# Patient Record
Sex: Male | Born: 1965
Health system: Southern US, Community
[De-identification: ages and names within clinical notes are randomized; demographics above are authoritative.]

## PROBLEM LIST (undated history)

## (undated) DIAGNOSIS — L309 Dermatitis, unspecified: Secondary | ICD-10-CM

## (undated) DIAGNOSIS — M51369 Other intervertebral disc degeneration, lumbar region without mention of lumbar back pain or lower extremity pain: Secondary | ICD-10-CM

## (undated) DIAGNOSIS — M5417 Radiculopathy, lumbosacral region: Secondary | ICD-10-CM

## (undated) DIAGNOSIS — M5136 Other intervertebral disc degeneration, lumbar region: Secondary | ICD-10-CM

## (undated) DIAGNOSIS — E785 Hyperlipidemia, unspecified: Secondary | ICD-10-CM

## (undated) DIAGNOSIS — T7840XA Allergy, unspecified, initial encounter: Secondary | ICD-10-CM

## (undated) DIAGNOSIS — M62562 Muscle wasting and atrophy, not elsewhere classified, left lower leg: Secondary | ICD-10-CM

## (undated) DIAGNOSIS — J302 Other seasonal allergic rhinitis: Secondary | ICD-10-CM

## (undated) DIAGNOSIS — J45909 Unspecified asthma, uncomplicated: Secondary | ICD-10-CM

## (undated) HISTORY — PX: OTHER SURGICAL HISTORY: SHX169

## (undated) HISTORY — DX: Other intervertebral disc degeneration, lumbar region without mention of lumbar back pain or lower extremity pain: M51.369

## (undated) HISTORY — DX: Radiculopathy, lumbosacral region: M54.17

## (undated) HISTORY — DX: Allergy, unspecified, initial encounter: T78.40XA

## (undated) HISTORY — DX: Other seasonal allergic rhinitis: J30.2

## (undated) HISTORY — DX: Dermatitis, unspecified: L30.9

## (undated) HISTORY — DX: Unspecified asthma, uncomplicated: J45.909

## (undated) HISTORY — DX: Hyperlipidemia, unspecified: E78.5

## (undated) HISTORY — DX: Other intervertebral disc degeneration, lumbar region: M51.36

## (undated) HISTORY — DX: Muscle wasting and atrophy, not elsewhere classified, left lower leg: M62.562

---

## 1998-02-01 ENCOUNTER — Ambulatory Visit (HOSPITAL_COMMUNITY): Admission: RE | Admit: 1998-02-01 | Discharge: 1998-02-01 | Payer: Self-pay | Admitting: Family Medicine

## 1998-02-01 ENCOUNTER — Encounter: Payer: Self-pay | Admitting: Family Medicine

## 2015-03-23 HISTORY — PX: COLONOSCOPY: SHX174

## 2015-04-18 LAB — PSA: PSA: 1.03

## 2015-04-18 LAB — LIPID PANEL
CHOLESTEROL: 230 — AB (ref 0–200)
HDL: 61 (ref 35–70)
LDL CALC: 156
Triglycerides: 64 (ref 40–160)

## 2015-04-18 LAB — TSH: TSH: 2.18 (ref 0.41–5.90)

## 2015-05-19 ENCOUNTER — Other Ambulatory Visit: Payer: Self-pay | Admitting: Family Medicine

## 2015-05-19 ENCOUNTER — Ambulatory Visit
Admission: RE | Admit: 2015-05-19 | Discharge: 2015-05-19 | Disposition: A | Discharge status: Home / Self Care | Payer: 59 | Source: Ambulatory Visit | Attending: Family Medicine | Admitting: Family Medicine

## 2015-05-19 DIAGNOSIS — R059 Cough, unspecified: Secondary | ICD-10-CM

## 2015-05-19 DIAGNOSIS — R05 Cough: Secondary | ICD-10-CM

## 2015-05-20 HISTORY — PX: COLONOSCOPY: SHX174

## 2015-05-20 LAB — HM COLONOSCOPY

## 2016-09-02 ENCOUNTER — Encounter: Payer: Self-pay | Admitting: Genetics

## 2016-09-02 ENCOUNTER — Ambulatory Visit (HOSPITAL_BASED_OUTPATIENT_CLINIC_OR_DEPARTMENT_OTHER): Payer: 59 | Admitting: Genetics

## 2016-09-02 ENCOUNTER — Other Ambulatory Visit: Payer: 59

## 2016-09-02 DIAGNOSIS — Z7183 Encounter for nonprocreative genetic counseling: Secondary | ICD-10-CM | POA: Diagnosis not present

## 2016-09-02 DIAGNOSIS — Z8481 Family history of carrier of genetic disease: Secondary | ICD-10-CM | POA: Diagnosis not present

## 2016-09-02 NOTE — Progress Notes (Signed)
REFERRING PROVIDER: No referring provider defined for this encounter.  PRIMARY PROVIDER:  Shirline Frees, MD  PRIMARY REASON FOR VISIT:  1. Family history of BRCA2 gene positive   2. Family history of gene mutation      HISTORY OF PRESENT ILLNESS:   Troy Taylor, a 51 y.o. male, was seen for a [place] cancer genetics consultation at the request of himself/his parents due to known familial mutations in [place] and CHEK2. Troy Taylor father underwent genetic testing for hereditary cancer syndromes in March 2018 and was identified to have heritable mutations in BRCA2 and CHEK2.  Troy Taylor presents to clinic today to discuss the possibility of a hereditary predisposition to cancer, genetic testing, and to further clarify his future cancer risks, as well as potential cancer risks for family members. Troy Taylor paternal half-brother, Troy Taylor, was also present for the genetic counseling session.  Troy Taylor is a 51 y.o. male with no personal history of cancer. He reports that he had a colonoscopy earlier this year that was normal. He also reports that he had prostate cancer screening within the past two years which was normal.  CANCER HISTORY:   No history exists.    No past medical history on file.  No past surgical history on file.  Social History   Social History  . Marital status: Married    Spouse name: N/A  . Number of children: N/A  . Years of education: N/A   Social History Main Topics  . Smoking status: Not on file  . Smokeless tobacco: Not on file  . Alcohol use Not on file  . Drug use: Unknown  . Sexual activity: Not on file   Other Topics Concern  . Not on file   Social History Narrative  . No narrative on file     FAMILY HISTORY:  We obtained a detailed, 4-generation family history.  Significant diagnoses are listed below: Family History  Problem Relation Age of Onset  . Renal cancer Father 44  . Lymphoma Father   . BRCA 1/2 Father        BRCA2 mutation  positive  . Other Father        CHEK2 mutation positive  . Breast cancer Paternal Grandmother 41       d.60  . Uterine cancer Paternal Grandmother 5   Troy Taylor has two sons, ages 25 and 13. He also has a paternal half-brother, Troy Taylor, who is 50. Mr. Forse sons and brother are without cancers.  Mr. Heo father, Troy Taylor DOB 08/28/1936, has a history of renal cancer in his 86s and lymphoma in his 41s. Mr. Lightcap father underwent genetic testing in March 2018 through [facility]'s Multi-Cancers Panel which included analysis of 80 genes linked to hereditary cancer risk. Testing revealed a mutation in the BRCA2gene called [id number] ([id number]*). 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 Of note, the laboratory shared that they can not rule out the possibility that his BRCA2 mutation is also mosaic and thus not reflective of what is present in his germline (constitutional) DNA. Testing of a different cell line (such as a skin biopsy) or finding the mutation in his children would help determine whether either the BRCA2 pathogenic variant and/or the CHEK2 likely pathogenic variant are present in his germline DNA. A copy of Mr. Niess father's the genetic test report is included at the end of this note for reference. Mr. Prom  father is an only child. Mr. Goeken paternal grandmother was diagnosed with breast and uterine cancer at 33 and died at 46. Mr. Smiles paternal grandfather died at 22 from renal failure.  Mr. Whistler mother is 79 without cancers. Mr. Gertsch has two maternal uncles and one maternal aunt who are all in their 5s without cancers. Mr. Raulston maternal grandparents died in their 10s and 66s without cancers.  Patient's maternal ancestors are of [place] descent, and paternal ancestors are of Polish/Ashkenazi Jewish descent. There is no known consanguinity.  GENETIC COUNSELING  ASSESSMENT: Troy Taylor is a 51 y.o. male with a father who carries mutations in BRCA2 and CHEK2. Though it is currently uncertain whether these mutations are germline or somatic, Troy Taylor has as much as a 50% chance to also carry each of these mutations. We, therefore, discussed and recommended the following at today's visit.   DISCUSSION: We reviewed the characteristics, features and inheritance patterns of hereditary cancer syndromes with a specific focus on BRCA2 and CHEK2-related risks. We also discussed genetic testing, including the appropriate family members to test, the process of testing, insurance coverage and turn-around-time for results. We discussed the implications of a negative, positive and/or variant of uncertain significant result. Troy Taylor was given multiple options for the extent of genetic testing he would like to pursue. We specifically reviewed the following three options: 1) Test only the BRCA2 and CHEK2 genes to determine whether he carries the two mutations identified by his father's testing. This testing is free under [facility]'s Family Variant Testing program. 2) Test through the 46-gene Common Hereditary Cancers Panel offered by [facility] 3) Test through the 83-gene Multi-Cancers Panel offered by [facility]. The benefits and limitations of each option were discussed. Troy Taylor elected to pursue genetic testing for the 46-gene Common Hereditary Cancers Panel. [facility]'s Common Hereditary Cancers Panel includes analysis of the following 46 genes: APC, ATM, AXIN2, BARD1, BMPR1A, BRCA1, BRCA2, BRIP1, CDH1, CDKN2A, CHEK2, CTNNA1, DICER1, EPCAM, GREM1, HOXB13, KIT, MEN1, MLH1, MSH2, MSH3, MSH6, MUTYH, NBN, NF1, NTHL1, PALB2, PDGFRA, PMS2, POLD1, POLE, PTEN, RAD50, RAD51C, RAD51D, SDHA, SDHB, SDHC, SDHD, SMAD4, SMARCA4, STK11, TP53, TSC1, TSC2, and VHL.   Based on Troy Taylor family history of cancer, he meets medical criteria for genetic testing. Despite that he meets criteria, he may  still have an out of pocket cost. We discussed that if his out of pocket cost for testing is over $100, the laboratory will call and confirm whether he wants to proceed with testing.  If the out of pocket cost of testing is less than $100 he will be billed by the genetic testing laboratory.   PLAN: After considering the risks, benefits, and limitations, Troy Taylor  provided informed consent to pursue genetic testing and the blood sample was sent to [facility] for analysis of the 46-gene Common Hereditary Cancers Panel. Results should be available within approximately 3 weeks' time, at which point they will be disclosed by telephone to Troy Taylor, as will any additional recommendations warranted by these results. This information will also be available in Epic. We encouraged Troy Taylor to remain in contact with cancer genetics annually so that we can continuously update the family history and inform him of any changes in cancer genetics and testing that may be of benefit for his family. Troy Taylor questions were answered to his satisfaction today. Our contact information was provided should additional questions or concerns arise.  Lastly, we encouraged Troy Taylor to remain in contact with cancer genetics  annually so that we can continuously update the family history and inform him of any changes in cancer genetics and testing that may be of benefit for this family.   Mr.  Taranto questions were answered to his satisfaction today. Our contact information was provided should additional questions or concerns arise. Thank you for the referral and allowing [name] to share in the care of your patient.   Mal Misty, MS, [facility] Certified Naval [email or web address] phone: [phone]  The patient was seen for a total of 30 minutes in face-to-face genetic counseling.  This patient was discussed with Drs. Magrinat, Lindi Adie and/or Burr Medico who agrees with the above.     _______________________________________________________________________ For Office Staff:  Number of people involved in session: 2 Was an Intern/ student involved with case: no

## 2016-09-24 ENCOUNTER — Telehealth: Payer: Self-pay | Admitting: Genetics

## 2016-09-24 NOTE — Telephone Encounter (Signed)
Mr. Troy Taylor called to ask about a bill he received from [facility]. He stated that [facility] and he was under the impression that his genetic testing was free so he was calling with questions. I discussed the following with Mr. Troy Taylor:  1) His genetic counseling appointment was billed, though I have no further information about what was or was not covered by his insurance for the appointment. I offered to put him in touch with [facility] billing department to discuss the bill and billing process further. He declines at this time, stating that he will contact [facility] instead.  2) Genetic testing is billed separately from his genetic counseling appointment. The billing for genetic testing is performed by the genetic testing laboratory, [facility]. [facility]'s policy is to call Mr. Troy Taylor if his out-of-pocket (OOP) expense for the testing is expected to exceed $100. I spoke with the laboratory this morning and they informed me that they have an authorization on file from [employer] [facility]. However, this does not mean that Mr. Troy Taylor will not have OOP expense. The lab will contact Mr. Troy Taylor if his OOP is greater than $100 and at that time, he would have the chance to cancel.  Mr. Troy Taylor stated that he will call me back if he has further questions.

## 2016-09-27 ENCOUNTER — Telehealth: Payer: Self-pay | Admitting: Genetics

## 2016-09-27 NOTE — Telephone Encounter (Signed)
Troy Taylor left me a message stating that he received email communication from [facility] that his out-of-pocket (OOP) responsibility for his genetic testing ordered on 09/02/2016 would be approximately $800. He stated that the email he received also reviewed various payment options. Troy Taylor voicemessage stated that he does not want to pay $800 for a test that he thought was going to be free. I called Troy Taylor back to review the following:  1) The testing that would have been free would have been performed through [facility]'s Family Variant Testing program which tests only the genes found to have mutations in his father (BRCA2 and CHEK2). That option was discussed at the time of his initial genetic counseling appointment. At the time of our meeting, Troy Taylor elected to test through a larger panel of 46-genes. Therefore, testing was billed to Troy Taylor insurance, thus the $800 OOP. We discussed that this larger panel would get billed to his insurance at the time of our initial meeting.  2) I encouraged Troy Taylor to contact [facility] directly regarding his cost/payment options from here.  3) Troy Taylor wanted to know if he still had the option to only test BRCA2 and CHEK2 through [facility]'s Family Variant Testing program at no cost. I called [facility] to inquire about this and was informed that this should be an option, but they would need me to provide a written request for this change in testing via [facility]'s portal. I called Troy Taylor to inform him of this option. He stated that he would like to read the email from [facility] more thoroughly before deciding. He stated that if he can pay $250 for the larger panel, he may still elect to do that. I clarified with Troy Taylor that I will not take any further action to change his testing until I hear back from him regarding his decision. He agreed to this plan. I will await his call.

## 2016-10-19 NOTE — Telephone Encounter (Signed)
Troy Taylor informed me that he would like to proceed with genetic testing through [facility]'s patient-pay price of $250 for testing of 46-gene through the Common Hereditary Cancers Panel. I agreed with Troy Taylor that I would inform [facility] of his decision and [facility] will contact him regarding payment. I will call Troy Taylor when results are available.

## 2016-11-04 ENCOUNTER — Telehealth: Payer: Self-pay | Admitting: Genetics

## 2016-11-11 ENCOUNTER — Emergency Department (HOSPITAL_COMMUNITY)
Admission: EM | Admit: 2016-11-11 | Discharge: 2016-11-11 | Disposition: A | Discharge status: Home / Self Care | Payer: 59 | Attending: Emergency Medicine | Admitting: Emergency Medicine

## 2016-11-11 ENCOUNTER — Encounter (HOSPITAL_COMMUNITY): Payer: Self-pay

## 2016-11-11 ENCOUNTER — Emergency Department (HOSPITAL_COMMUNITY): Payer: 59

## 2016-11-11 DIAGNOSIS — R0602 Shortness of breath: Secondary | ICD-10-CM | POA: Insufficient documentation

## 2016-11-11 DIAGNOSIS — J45909 Unspecified asthma, uncomplicated: Secondary | ICD-10-CM | POA: Diagnosis not present

## 2016-11-11 DIAGNOSIS — R42 Dizziness and giddiness: Secondary | ICD-10-CM

## 2016-11-11 DIAGNOSIS — R61 Generalized hyperhidrosis: Secondary | ICD-10-CM | POA: Diagnosis not present

## 2016-11-11 DIAGNOSIS — R110 Nausea: Secondary | ICD-10-CM | POA: Insufficient documentation

## 2016-11-11 DIAGNOSIS — R079 Chest pain, unspecified: Secondary | ICD-10-CM | POA: Diagnosis not present

## 2016-11-11 DIAGNOSIS — R000 Tachycardia, unspecified: Secondary | ICD-10-CM | POA: Diagnosis not present

## 2016-11-11 DIAGNOSIS — R9431 Abnormal electrocardiogram [ECG] [EKG]: Secondary | ICD-10-CM | POA: Diagnosis not present

## 2016-11-11 LAB — CBC
HCT: 44 % (ref 39.0–52.0)
HEMOGLOBIN: 14.6 g/dL (ref 13.0–17.0)
MCH: 28 pg (ref 26.0–34.0)
MCHC: 33.2 g/dL (ref 30.0–36.0)
MCV: 84.5 fL (ref 78.0–100.0)
Platelets: 191 10*3/uL (ref 150–400)
RBC: 5.21 MIL/uL (ref 4.22–5.81)
RDW: 13.7 % (ref 11.5–15.5)
WBC: 6 10*3/uL (ref 4.0–10.5)

## 2016-11-11 LAB — BASIC METABOLIC PANEL
ANION GAP: 7 (ref 5–15)
BUN: 20 mg/dL (ref 6–20)
CHLORIDE: 109 mmol/L (ref 101–111)
CO2: 27 mmol/L (ref 22–32)
Calcium: 9.6 mg/dL (ref 8.9–10.3)
Creatinine, Ser: 1.16 mg/dL (ref 0.61–1.24)
GFR calc non Af Amer: >60 mL/min (ref >60)
Glucose, Bld: 88 mg/dL (ref 65–99)
POTASSIUM: 4.4 mmol/L (ref 3.5–5.1)
Sodium: 143 mmol/L (ref 135–145)

## 2016-11-11 LAB — I-STAT TROPONIN, ED
TROPONIN I, POC: 0 ng/mL (ref 0.00–0.08)
Troponin i, poc: 0 ng/mL (ref 0.00–0.08)

## 2016-11-11 NOTE — Discharge Instructions (Signed)
Try Zyrtec for allergies Drink plenty of fluids Please follow up with your doctor

## 2016-11-11 NOTE — ED Triage Notes (Signed)
Pt presents from PCP office for abnormal EKG. Pt reports woke up this AM and was dizziness and weak. No focal neuro symptoms. LKW last night. Pt reports some intermittent chest tightness/SOB x 1 week.

## 2016-11-11 NOTE — ED Provider Notes (Signed)
[facility] DEPT Provider Note   CSN: [id number] Arrival date & time: 11/11/16  1021     History   Chief Complaint Chief Complaint  Patient presents with  . Abnormal ECG    HPI Troy Taylor is a 51 y.o. male who presents with dizziness. PMH of allergies and asthma. He states that he woke up this morning acutely dizzy. He had associated chills, diaphoresis, and weakness. He sat up and the dizziness got better. It did not feel like he was going to pass out. He states it feels like the "worst hangover". He went to his doctor's office who performed an EKG which appeared changed compared to prior so they sent him to the ED for cardiac eval. He states he has had intermittent chest tightness and SOB over the past couple weeks. He reports insomnia as well - sleeping about 3-4 hours a night. He has a dull headache, nausea, and bilateral leg cramps/aches. He has been working outside a lot recently moving mulch. He cannot recall pulling ticks off of him or a rash. He has never smoked. No family hx of cardiac disease. He is not on any medicines. No recent surgeries/trauma, travel, hormone use, hx of DVT/PE, or hemoptysis.  HPI  History reviewed. No pertinent past medical history.  There are no active problems to display for this patient.   History reviewed. No pertinent surgical history.     Home Medications    Prior to Admission medications   Not on File    Family History Family History  Problem Relation Age of Onset  . Renal cancer Father 64  . Lymphoma Father   . BRCA 1/2 Father        BRCA2 mutation positive  . Other Father        CHEK2 mutation positive  . Breast cancer Paternal Grandmother 18       d.60  . Uterine cancer Paternal Grandmother 68    Social History Social History  Substance Use Topics  . Smoking status: Never Smoker  . Smokeless tobacco: Never Used  . Alcohol use No     Allergies   Penicillins   Review of Systems Review of Systems    Constitutional: Positive for chills, diaphoresis and fatigue. Negative for fever.  Respiratory: Positive for chest tightness and shortness of breath. Negative for cough and wheezing.   Cardiovascular: Negative for chest pain, palpitations and leg swelling.  Gastrointestinal: Positive for nausea. Negative for abdominal pain, diarrhea and vomiting.  Genitourinary: Negative for difficulty urinating.       -dark urine  Musculoskeletal: Positive for myalgias.  Allergic/Immunologic: Positive for environmental allergies.  Neurological: Positive for dizziness, weakness (generalized) and headaches. Negative for syncope.     Physical Exam Updated Vital Signs BP 125/82 (BP Location: Right Arm)   Pulse (!) 57   Temp 97.6 F (36.4 C) (Oral)   Resp 18   SpO2 100%   Physical Exam  Constitutional: He is oriented to person, place, and time. He appears well-developed and well-nourished. No distress.  HENT:  Head: Normocephalic and atraumatic.  Eyes: Pupils are equal, round, and reactive to light. Conjunctivae are normal. Right eye exhibits no discharge. Left eye exhibits no discharge. No scleral icterus.  Neck: Normal range of motion.  Cardiovascular: Normal rate and regular rhythm.   Pulmonary/Chest: Effort normal. No respiratory distress.  Abdominal: He exhibits no distension.  Neurological: He is alert and oriented to person, place, and time.  Lying on stretcher in NAD. GCS 15. Speaks  in a clear voice. Cranial nerves II through XII grossly intact. 5/5 strength in all extremities. Sensation fully intact.  Bilateral finger-to-nose intact. Ambulatory    Skin: Skin is warm and dry.  Psychiatric: He has a normal mood and affect. His behavior is normal.  Nursing note and vitals reviewed.    ED Treatments / Results  Labs (all labs ordered are listed, but only abnormal results are displayed) Labs Reviewed  BASIC METABOLIC PANEL  CBC  I-STAT TROPONIN, ED  I-STAT TROPONIN, ED    EKG  EKG  Interpretation  Date/Time:  Thursday November 11 2016 10:23:48 EDT Ventricular Rate:  56 PR Interval:  162 QRS Duration: 78 QT Interval:  408 QTC Calculation: 393 R Axis:   84 Text Interpretation:  Sinus bradycardia with sinus arrhythmia Otherwise normal ECG No previous ECGs available Confirmed by Theotis Burrow [phone]) on 11/11/2016 11:51:06 AM       Radiology Dg Chest 2 View  Result Date: 11/11/2016 CLINICAL DATA:  Chest pressure sensation associated with dizziness and weakness. Nonsmoker. EXAM: CHEST  2 VIEW COMPARISON:  Chest x-ray of May 19, 2015 FINDINGS: The lungs are well-expanded and clear. The heart and pulmonary vascularity are normal. The mediastinum is normal in width. There is no pleural effusion. The bony thorax exhibits no acute abnormality. IMPRESSION: Mild hyperinflation may be voluntary or may reflect underlying reactive airway disease or chronic bronchitis. No pneumonia, CHF, nor other acute cardiopulmonary abnormality. Electronically Signed   By: David  Martinique M.D.   On: 11/11/2016 10:49    Procedures Procedures (including critical care time)  Medications Ordered in ED Medications - No data to display   Initial Impression / Assessment and Plan / ED Course  I have reviewed the triage vital signs and the nursing notes.  Pertinent labs & imaging results that were available during my care of the patient were reviewed by me and considered in my medical decision making (see chart for details).  51 year old male presents with a constellation of symptoms. Unclear etiology - possibly a viral infection since he describes flu like symptoms. He is bradycardic which is likely physiologic for him in an overall healthy middle aged male. All other vitals are normal. EKG shows sinus bradycardia. CXR is negative. Labs are normal. 1st and 2nd troponin are normal.   Advised increase fluids and take Zyrtec for allergy symptoms. Discussed with patient that if he is very dizzy we  can administer fluids. He declined. Will d/c with close f/u with PCP.  Final Clinical Impressions(s) / ED Diagnoses   Final diagnoses:  Dizziness  SOB (shortness of breath)    New Prescriptions New Prescriptions   No medications on file     Iris Pert 11/12/16 [id number]    Little, Wenda Overland, MD 11/13/16 [phone]

## 2016-11-16 ENCOUNTER — Ambulatory Visit: Payer: Self-pay | Admitting: Genetics

## 2016-11-16 DIAGNOSIS — Z1379 Encounter for other screening for genetic and chromosomal anomalies: Secondary | ICD-10-CM | POA: Insufficient documentation

## 2016-11-16 NOTE — Progress Notes (Signed)
Results and genetic counseling documentation mailed to Troy Taylor address on file 11/16/2016.

## 2016-11-16 NOTE — Progress Notes (Signed)
HPI:Mr. Spagnolo was previously seen in the [place] clinic on 09/02/2016 due to his father's positive genetic testing results and a family history of cancer. Mr. Corales father underwent genetic testing for hereditary cancer syndromes in March 2018 and was identified to have heritable mutations in BRCA2 and CHEK2. Please refer to our prior cancer genetics clinic note for more information regarding Mr. Endsley medical, social and family histories, and our assessment and recommendations, at the time. Mr. Truex recent genetic test results were disclosed to him, as were recommendations warranted by these results. These results and recommendations are discussed in more detail below.  CANCER HISTORY: Mr. Wahlen is a 51 y.o. male with no personal history of cancer. He reports that he had a colonoscopy earlier this year that was normal. He also reports that he had prostate cancer screening within the past two years which was normal.  FAMILY HISTORY:  We obtained a detailed, 4-generation family history.  Significant diagnoses are listed below: Family History  Problem Relation Age of Onset  . Renal cancer Father 58  . Lymphoma Father   . BRCA 1/2 Father        BRCA2 mutation positive  . Other Father        CHEK2 mutation positive  . Breast cancer Paternal Grandmother 78       d.60  . Uterine cancer Paternal Grandmother 84   Mr. Vanaman has two sons, ages 89 and 1. He also has a paternal half-brother, [name], who is 76. Mr. Buchta sons and brother are without cancers.  Mr. Otten father, Lestat Golob DOB 08/28/1934, has a history of renal cancer in his 53s and lymphoma in his 66s. Mr. Moya father underwent genetic testing in March 2018 through [facility] Multi-Cancers Panel which included analysis of 80 genes linked to hereditary cancer risk. Testing revealed a mutation in the BRCA2gene called c.8210T>A ([id number]*). 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 Of note, the laboratory shared that they can not rule out the possibility that his BRCA2 mutation is also mosaic and thus not reflective of what is present in his germline (constitutional) DNA. Testing of a different cell line (such as a skin biopsy) or finding the mutation in his children would help determine whether either the BRCA2 pathogenic variant and/or the CHEK2 likely pathogenic variant are present in his germline DNA. A copy of Mr. Salguero father'sthe genetic test report is included at the end of this note for reference. Mr. Lefevers father is an only child. Mr. Rawles paternal grandmother was diagnosed with breast and uterine cancer at 67 and died at 81. Mr. Keehan paternal grandfather died at 32 from renal failure.  Mr. Cantera mother is 52 without cancers. Mr. Heppler has two maternal uncles and one maternal aunt who are all in their 72s without cancers. Mr. Doolittle maternal grandparents died in their 46s and 27s without cancers.  Patient's maternal ancestors are of [place] descent, and paternal ancestors are of Polish/Ashkenazi Jewish descent. There is no known consanguinity.  GENETIC TEST RESULTS: Genetic testing performed through [facility] Common Hereditary Cancer Panel reported out on 10/25/2016 showed no pathogenic mutations. [facility] Common Hereditary Cancers Panel includes analysis of the following 46 genes: APC, ATM, AXIN2, BARD1, BMPR1A, BRCA1, BRCA2, BRIP1, CDH1, CDKN2A, CHEK2, CTNNA1, DICER1, EPCAM, GREM1, HOXB13, KIT, MEN1, MLH1, MSH2, MSH3, MSH6, MUTYH, NBN, NF1, NTHL1, PALB2, PDGFRA, PMS2, POLD1, POLE, PTEN, RAD50, RAD51C, RAD51D, SDHA,  SDHB, SDHC, SDHD, SMAD4, SMARCA4, STK11, TP53, TSC1, TSC2, and VHL.  Specifically, Mr. Batty tested negative for the BRCA2, c.8210T>A ([id number]*) and CHEK2, H.7414+2L>T (Splice donor) mutations that were previously identified in his father.  The test  report will be scanned into EPIC and will be located under the Molecular Pathology section of the Results Review tab.A portion of the result report is included below for reference.    CANCER SCREENING RECOMMENDATIONS: Given this negative result, it appears that Mr. Chavarin chances of developing BRCA2 and CHEK2-related cancers (male breast, prostate, pancreatic, melanoma, colon cancer) are the same as they are in the general population.   Because Mr. Yutzy father's cancer diagnoses remain unexplained (lymphoma and renal cancers are not known to be associated with BRCA2 and CHEK2 mutations) Mr. Comer risk for lymphoma and renal cancer may be above-average based on family history alone. However, currently, there are no established screening guidelines for individuals with a family history of lymphoma and renal cancer. If Mr. Weissberg would like to consider screening for these cancers, he should consult his primary care physician regarding options. Otherwise, Mr. Baldree is advised to follow age-based, general population cancer screening recommendations unless otherwise advised by his physicians.   Because Mr. Fludd genetic testing was negative for mutations in all 46 genes analyzed, there is no genetic testing recommended for his children due to his family history of cancer.   FOLLOW-UP: Lastly, cancer genetics is a rapidly advancing field and it is possible that new genetic tests will be appropriate for him and/or his family members in the future. We encourage him to remain in contact with cancer genetics on an annual basis so we can update his personal and family histories and let him know of advances in cancer genetics that may benefit this family.   Our contact number was provided. Mr. Pingree questions were answered to his satisfaction, and he knows he is welcome to call us at anytime with additional questions or concerns.   Mal Misty, MS, [facility] Certified Scientist, [email or web address]

## 2016-11-16 NOTE — Telephone Encounter (Signed)
Reviewed that germline genetic testing revealed no pathogenic mutations. This is considered to be a negative result. Testing was performed through [facility]'s 46-gene Common Hereditary Cancers Panel. [facility] Common Hereditary Cancers Panel includes analysis of the following 46 genes: APC, ATM, AXIN2, BARD1, BMPR1A, BRCA1, BRCA2, BRIP1, CDH1, CDKN2A, CHEK2, CTNNA1, DICER1, EPCAM, GREM1, HOXB13, KIT, MEN1, MLH1, MSH2, MSH3, MSH6, MUTYH, NBN, NF1, NTHL1, PALB2, PDGFRA, PMS2, POLD1, POLE, PTEN, RAD50, RAD51C, RAD51D, SDHA, SDHB, SDHC, SDHD, SMAD4, SMARCA4, STK11, TP53, TSC1, TSC2, and VHL.  For more detailed discussion, please see genetic counseling documentation from 11/16/2016. Result report dated 10/25/2016.

## 2016-12-15 DIAGNOSIS — R05 Cough: Secondary | ICD-10-CM | POA: Diagnosis not present

## 2016-12-15 DIAGNOSIS — J069 Acute upper respiratory infection, unspecified: Secondary | ICD-10-CM | POA: Diagnosis not present

## 2017-01-09 DIAGNOSIS — Z23 Encounter for immunization: Secondary | ICD-10-CM | POA: Diagnosis not present

## 2017-01-20 DIAGNOSIS — M62562 Muscle wasting and atrophy, not elsewhere classified, left lower leg: Secondary | ICD-10-CM

## 2017-01-20 HISTORY — DX: Muscle wasting and atrophy, not elsewhere classified, left lower leg: M62.562

## 2017-01-25 DIAGNOSIS — L300 Nummular dermatitis: Secondary | ICD-10-CM | POA: Diagnosis not present

## 2017-01-31 ENCOUNTER — Telehealth: Payer: Self-pay | Admitting: *Deleted

## 2017-01-31 ENCOUNTER — Encounter: Payer: Self-pay | Admitting: *Deleted

## 2017-01-31 ENCOUNTER — Encounter: Payer: Self-pay | Admitting: Family Medicine

## 2017-01-31 NOTE — Telephone Encounter (Signed)
New pt atp on 02/02/17  Received medical records [facility].  Reviewed records and abstracted information into pts chart.   Records have been placed on Dr. Samul DadaMcGowen's desk for review.

## 2017-02-02 ENCOUNTER — Other Ambulatory Visit: Payer: Self-pay

## 2017-02-02 ENCOUNTER — Ambulatory Visit (INDEPENDENT_AMBULATORY_CARE_PROVIDER_SITE_OTHER): Payer: 59 | Admitting: Family Medicine

## 2017-02-02 ENCOUNTER — Encounter: Payer: Self-pay | Admitting: Family Medicine

## 2017-02-02 VITALS — BP 99/66 | HR 63 | Temp 98.6°F | Resp 16 | Ht 69.0 in | Wt 176.2 lb

## 2017-02-02 DIAGNOSIS — R29898 Other symptoms and signs involving the musculoskeletal system: Secondary | ICD-10-CM

## 2017-02-02 DIAGNOSIS — M62562 Muscle wasting and atrophy, not elsewhere classified, left lower leg: Secondary | ICD-10-CM

## 2017-02-02 DIAGNOSIS — G8929 Other chronic pain: Secondary | ICD-10-CM | POA: Diagnosis not present

## 2017-02-02 DIAGNOSIS — M6281 Muscle weakness (generalized): Secondary | ICD-10-CM | POA: Diagnosis not present

## 2017-02-02 DIAGNOSIS — M5442 Lumbago with sciatica, left side: Secondary | ICD-10-CM

## 2017-02-02 NOTE — Progress Notes (Signed)
Office Note 02/02/2017  CC:  Chief Complaint  Patient presents with  . Establish Care  . ER Visit    2 months ago    HPI:  Troy Taylor is a 51 y.o. male who is here to establish care and discuss some sx's he's been having lately. Patient's most recent primary MD: Dr. Kenton Kingfisher ([facility]). Old records were reviewed prior to or during today's visit.  Had a acute bronchitis episode about 3 weeks ago that just resolved for the most part about 1-2 weeks ago. Still some lingering fatigue and tickle-induced cough.  No wheezing.  No SOB.   Seems like a z-pack helped some, tessalon no help.  No other rx meds given. Sounds like he is essentially over this. He also rehashed his illness that occurred in 10/2016---sounds like a viral syndrome of some kind.  His PCP sent him to ED to r/o cardiac problem causing the symptom (mainly dizziness and diaphoresis).  I reviewed this ED visit data today: CXR normal, vitals normal, troponins neg x 2, EKG normal).  Also noted quite a bit of decreased muscle mass in L calf about 1 mo ago. No pain, no redness, no recollection of any injury.  ROS: chronic LB pain about 15+ yrs now.  X-ray showed some ? DDD and he has tendency to have acute flare ups of LBP and L leg radicular pain.  Occ paresthesias in L leg.  Denies L leg weakness.   Past Medical History:  Diagnosis Date  . Allergy   . Asthma   . DDD (degenerative disc disease), lumbar   . Eczema   . Hyperlipidemia   . Seasonal allergies     Past Surgical History:  Procedure Laterality Date  . COLONOSCOPY  2017   Medoff:    Family History  Problem Relation Age of Onset  . Diabetes Mother   . Hypertension Mother   . Arthritis Mother   . Renal cancer Father 74  . Lymphoma Father   . BRCA 1/2 Father        BRCA2 mutation positive  . Other Father        CHEK2 mutation positive  . Asthma Father   . Allergies Father   . Breast cancer Paternal Grandmother 71       d.60  . Uterine  cancer Paternal Grandmother 38  . Cancer Paternal Grandfather        unknown type    Social History   Socioeconomic History  . Marital status: Married    Spouse name: Not on file  . Number of children: Not on file  . Years of education: Not on file  . Highest education level: Not on file  Social Needs  . Financial resource strain: Not on file  . Food insecurity - worry: Not on file  . Food insecurity - inability: Not on file  . Transportation needs - medical: Not on file  . Transportation needs - non-medical: Not on file  Occupational History  . Not on file  Tobacco Use  . Smoking status: Never Smoker  . Smokeless tobacco: Never Used  Substance and Sexual Activity  . Alcohol use: No  . Drug use: No  . Sexual activity: Not on file  Other Topics Concern  . Not on file  Social History Narrative   Married, 2 sons.   Educ:college   Occup: Recruitment consultant"   Tob: never.   Alc: rare   Drugs none.    MEDS: none  Allergies  Allergen Reactions  . Penicillins     ROS Review of Systems  Constitutional: Positive for fatigue. Negative for fever.  HENT: Negative for congestion and sore throat.   Eyes: Negative for visual disturbance.  Respiratory: Negative for cough.   Cardiovascular: Negative for chest pain.  Gastrointestinal: Negative for abdominal pain and nausea.  Genitourinary: Negative for dysuria.  Musculoskeletal: Negative for back pain and joint swelling.  Skin: Negative for rash.  Neurological: Positive for weakness (mild, in L LL he finds it harder to do toe raises compared to R.). Negative for headaches.  Hematological: Negative for adenopathy.  Psychiatric/Behavioral: Negative for dysphoric mood. The patient is not nervous/anxious.     PE; Blood pressure 99/66, pulse 63, temperature 98.6 F (37 C), temperature source Oral, resp. rate 16, height _0  (1.753 m), weight 176 lb 4 oz (79.9 kg), SpO2 95 %. Gen: Alert, well appearing.  Patient is oriented to person,  place, time, and situation. AFFECT: pleasant, lucid thought and speech. LMB:EMLJ: no injection, icteris, swelling, or exudate.  EOMI, PERRLA. Mouth: lips without lesion/swelling.  Oral mucosa pink and moist. Oropharynx without erythema, exudate, or swelling.  Neck - No masses or thyromegaly or limitation in range of motion CV: RRR, no m/r/g.   LUNGS: CTA bilat, nonlabored resps, good aeration in all lung fields. EXT: no clubbing, cyanosis, or edema.  L calf with notable muscle atrophy, without tenderness or erythema.  L calf circumference at 10cm below inf patella border is 36 cm, R LL is 38.5 cm Sensation in L and R leg normal.   Left leg strength 5/5 proximal and distal when tested against my re sistance, but when pt was asked to do toe raises he had no problem on R but was impaired in ability to do these with L leg.  Pertinent labs:  None today  ASSESSMENT AND PLAN:   New pt:   1) Viral URI with acute asthmatic bronchitis: resolved. He has a bit of lingering fatigue.  Reassured pt.  2) Left calf muscle atrophy. Suspect this may be secondary to chronic lumbar DDD with spinal nerve radiculopathy and myopathy. I recommended he see neurologist for further evaluation and management--ordered today. I will check L/S spine plain films today.  An After Visit Summary was printed and given to the patient.  Return for keep appt scheduled later this week for CPE.  Signed:  Crissie Sickles, MD           02/02/2017

## 2017-02-03 ENCOUNTER — Other Ambulatory Visit (INDEPENDENT_AMBULATORY_CARE_PROVIDER_SITE_OTHER): Payer: 59

## 2017-02-03 ENCOUNTER — Other Ambulatory Visit: Payer: Self-pay | Admitting: *Deleted

## 2017-02-03 DIAGNOSIS — Z125 Encounter for screening for malignant neoplasm of prostate: Secondary | ICD-10-CM

## 2017-02-03 DIAGNOSIS — Z0000 Encounter for general adult medical examination without abnormal findings: Secondary | ICD-10-CM

## 2017-02-03 LAB — CBC WITH DIFFERENTIAL/PLATELET
BASOS PCT: 1.1 % (ref 0.0–3.0)
Basophils Absolute: 0.1 10*3/uL (ref 0.0–0.1)
EOS PCT: 3.9 % (ref 0.0–5.0)
Eosinophils Absolute: 0.2 10*3/uL (ref 0.0–0.7)
HEMATOCRIT: 44.7 % (ref 39.0–52.0)
HEMOGLOBIN: 14.3 g/dL (ref 13.0–17.0)
Lymphocytes Relative: 36.2 % (ref 12.0–46.0)
Lymphs Abs: 2.1 10*3/uL (ref 0.7–4.0)
MCHC: 32 g/dL (ref 30.0–36.0)
MCV: 87.2 fl (ref 78.0–100.0)
MONO ABS: 0.5 10*3/uL (ref 0.1–1.0)
Monocytes Relative: 8.4 % (ref 3.0–12.0)
Neutro Abs: 2.9 10*3/uL (ref 1.4–7.7)
Neutrophils Relative %: 50.4 % (ref 43.0–77.0)
Platelets: 217 10*3/uL (ref 150.0–400.0)
RBC: 5.13 Mil/uL (ref 4.22–5.81)
RDW: 14.2 % (ref 11.5–15.5)
WBC: 5.7 10*3/uL (ref 4.0–10.5)

## 2017-02-03 LAB — LIPID PANEL
CHOLESTEROL: 213 mg/dL — AB (ref 0–200)
HDL: 57.2 mg/dL (ref >39.00)
LDL Cholesterol: 140 mg/dL — ABNORMAL HIGH (ref 0–99)
NonHDL: 155.32
Total CHOL/HDL Ratio: 4
Triglycerides: 75 mg/dL (ref 0.0–149.0)
VLDL: 15 mg/dL (ref 0.0–40.0)

## 2017-02-03 LAB — COMPREHENSIVE METABOLIC PANEL
ALBUMIN: 4.5 g/dL (ref 3.5–5.2)
ALK PHOS: 55 U/L (ref 39–117)
ALT: 20 U/L (ref 0–53)
AST: 21 U/L (ref 0–37)
BUN: 19 mg/dL (ref 6–23)
CALCIUM: 9.8 mg/dL (ref 8.4–10.5)
CHLORIDE: 103 meq/L (ref 96–112)
CO2: 32 meq/L (ref 19–32)
Creatinine, Ser: 1.13 mg/dL (ref 0.40–1.50)
GFR: 72.14 mL/min (ref >60.00)
GLUCOSE: 99 mg/dL (ref 70–99)
POTASSIUM: 4.3 meq/L (ref 3.5–5.1)
SODIUM: 140 meq/L (ref 135–145)
Total Bilirubin: 0.8 mg/dL (ref 0.2–1.2)
Total Protein: 7.1 g/dL (ref 6.0–8.3)

## 2017-02-03 LAB — PSA: PSA: 3.1 ng/mL (ref 0.10–4.00)

## 2017-02-03 LAB — TSH: TSH: 2.37 u[IU]/mL (ref 0.35–4.50)

## 2017-02-04 ENCOUNTER — Ambulatory Visit (INDEPENDENT_AMBULATORY_CARE_PROVIDER_SITE_OTHER): Payer: 59 | Admitting: Family Medicine

## 2017-02-04 ENCOUNTER — Encounter: Payer: Self-pay | Admitting: Family Medicine

## 2017-02-04 ENCOUNTER — Other Ambulatory Visit: Payer: Self-pay

## 2017-02-04 VITALS — BP 119/68 | HR 77 | Temp 97.9°F | Resp 16 | Ht 69.0 in | Wt 176.2 lb

## 2017-02-04 DIAGNOSIS — Z0000 Encounter for general adult medical examination without abnormal findings: Secondary | ICD-10-CM

## 2017-02-04 DIAGNOSIS — R972 Elevated prostate specific antigen [PSA]: Secondary | ICD-10-CM | POA: Diagnosis not present

## 2017-02-04 DIAGNOSIS — Z23 Encounter for immunization: Secondary | ICD-10-CM

## 2017-02-04 DIAGNOSIS — Z125 Encounter for screening for malignant neoplasm of prostate: Secondary | ICD-10-CM | POA: Diagnosis not present

## 2017-02-04 NOTE — Patient Instructions (Signed)
Apply generic, over the counter Lamisil to the affected area of skin twice daily---this usually takes 2-3 weeks to go away with this treatment.   Health Maintenance, Male A healthy lifestyle and preventive care is important for your health and wellness. Ask your health care provider about what schedule of regular examinations is right for you. What should I know about weight and diet? Eat a Healthy Diet  Eat plenty of vegetables, fruits, whole grains, low-fat dairy products, and lean protein.  Do not eat a lot of foods high in solid fats, added sugars, or salt.  Maintain a Healthy Weight Regular exercise can help you achieve or maintain a healthy weight. You should:  Do at least 150 minutes of exercise each week. The exercise should increase your heart rate and make you sweat (moderate-intensity exercise).  Do strength-training exercises at least twice a week.  Watch Your Levels of Cholesterol and Blood Lipids  Have your blood tested for lipids and cholesterol every 5 years starting at 53 years of age. If you are at high risk for heart disease, you should start having your blood tested when you are 53 years old. You may need to have your cholesterol levels checked more often if: ? Your lipid or cholesterol levels are high. ? You are older than 53 years of age. ? You are at high risk for heart disease.  What should I know about cancer screening? Many types of cancers can be detected early and may often be prevented. Lung Cancer  You should be screened every year for lung cancer if: ? You are a current smoker who has smoked for at least 30 years. ? You are a former smoker who has quit within the past 15 years.  Talk to your health care provider about your screening options, when you should start screening, and how often you should be screened.  Colorectal Cancer  Routine colorectal cancer screening usually begins at 53 years of age and should be repeated every 5-10 years until you  are 53 years old. You may need to be screened more often if early forms of precancerous polyps or small growths are found. Your health care provider may recommend screening at an earlier age if you have risk factors for colon cancer.  Your health care provider may recommend using home test kits to check for hidden blood in the stool.  A small camera at the end of a tube can be used to examine your colon (sigmoidoscopy or colonoscopy). This checks for the earliest forms of colorectal cancer.  Prostate and Testicular Cancer  Depending on your age and overall health, your health care provider may do certain tests to screen for prostate and testicular cancer.  Talk to your health care provider about any symptoms or concerns you have about testicular or prostate cancer.  Skin Cancer  Check your skin from head to toe regularly.  Tell your health care provider about any new moles or changes in moles, especially if: ? There is a change in a mole's size, shape, or color. ? You have a mole that is larger than a pencil eraser.  Always use sunscreen. Apply sunscreen liberally and repeat throughout the day.  Protect yourself by wearing long sleeves, pants, a wide-brimmed hat, and sunglasses when outside.  What should I know about heart disease, diabetes, and high blood pressure?  If you are 1518-53 years of age, have your blood pressure checked every 3-5 years. If you are 53 years of age or older,  have your blood pressure checked every year. You should have your blood pressure measured twice-once when you are at a hospital or clinic, and once when you are not at a hospital or clinic. Record the average of the two measurements. To check your blood pressure when you are not at a hospital or clinic, you can use: ? An automated blood pressure machine at a pharmacy. ? A home blood pressure monitor.  Talk to your health care provider about your target blood pressure.  If you are between 62-34 years old,  ask your health care provider if you should take aspirin to prevent heart disease.  Have regular diabetes screenings by checking your fasting blood sugar level. ? If you are at a normal weight and have a low risk for diabetes, have this test once every three years after the age of 71. ? If you are overweight and have a high risk for diabetes, consider being tested at a younger age or more often.  A one-time screening for abdominal aortic aneurysm (AAA) by ultrasound is recommended for men aged 107-75 years who are current or former smokers. What should I know about preventing infection? Hepatitis B If you have a higher risk for hepatitis B, you should be screened for this virus. Talk with your health care provider to find out if you are at risk for hepatitis B infection. Hepatitis C Blood testing is recommended for:  Everyone born from [birth year] through 1967.  Anyone with known risk factors for hepatitis C.  Sexually Transmitted Diseases (STDs)  You should be screened each year for STDs including gonorrhea and chlamydia if: ? You are sexually active and are younger than 53 years of age. ? You are older than 53 years of age and your health care provider tells you that you are at risk for this type of infection. ? Your sexual activity has changed since you were last screened and you are at an increased risk for chlamydia or gonorrhea. Ask your health care provider if you are at risk.  Talk with your health care provider about whether you are at high risk of being infected with HIV. Your health care provider may recommend a prescription medicine to help prevent HIV infection.  What else can I do?  Schedule regular health, dental, and eye exams.  Stay current with your vaccines (immunizations).  Do not use any tobacco products, such as cigarettes, chewing tobacco, and e-cigarettes. If you need help quitting, ask your health care provider.  Limit alcohol intake to no more than 2 drinks per  day. One drink equals 12 ounces of beer, 5 ounces of wine, or 1 ounces of hard liquor.  Do not use street drugs.  Do not share needles.  Ask your health care provider for help if you need support or information about quitting drugs.  Tell your health care provider if you often feel depressed.  Tell your health care provider if you have ever been abused or do not feel safe at home. This information is not intended to replace advice given to you by your health care provider. Make sure you discuss any questions you have with your health care provider. Document Released: 09/04/2007 Document Revised: 11/05/2015 Document Reviewed: 12/10/2014 Elsevier Interactive Patient Education  [employer].

## 2017-02-04 NOTE — Progress Notes (Signed)
Office Note 02/04/2017  CC:  Chief Complaint  Patient presents with  . Annual Exam    HPI:  Troy Taylor is a 51 y.o. White male who is here for annual health maintenance exam. We reviewed recent fasting labs in detail today. Most significant: LDL mildly elevated, but 10 yr Framingham CV risk 2.8%---no statin recommended. Also, his PSA rose from 1.03 to 3.1 over the last year.  Eyes: exam UTD. Dental: preventatives annually. Derm: recent full screening exam normal. Exercise: none Diet: says he doesn't eat much  Past Medical History:  Diagnosis Date  . Allergy   . Asthma   . DDD (degenerative disc disease), lumbar   . Eczema   . Hyperlipidemia   . Seasonal allergies     Past Surgical History:  Procedure Laterality Date  . COLONOSCOPY  2017   Medoff:    Family History  Problem Relation Age of Onset  . Diabetes Mother   . Hypertension Mother   . Arthritis Mother   . Renal cancer Father 25  . Lymphoma Father   . BRCA 1/2 Father        BRCA2 mutation positive  . Other Father        CHEK2 mutation positive  . Asthma Father   . Allergies Father   . Breast cancer Paternal Grandmother 9       d.60  . Uterine cancer Paternal Grandmother 11  . Cancer Paternal Grandfather        unknown type    Social History   Socioeconomic History  . Marital status: Married    Spouse name: Not on file  . Number of children: Not on file  . Years of education: Not on file  . Highest education level: Not on file  Social Needs  . Financial resource strain: Not on file  . Food insecurity - worry: Not on file  . Food insecurity - inability: Not on file  . Transportation needs - medical: Not on file  . Transportation needs - non-medical: Not on file  Occupational History  . Not on file  Tobacco Use  . Smoking status: Never Smoker  . Smokeless tobacco: Never Used  Substance and Sexual Activity  . Alcohol use: No  . Drug use: No  . Sexual activity: Not on file   Other Topics Concern  . Not on file  Social History Narrative   Married, 2 sons.   Educ:college   Occup: Recruitment consultant"   Tob: never.   Alc: rare   Drugs none.    No outpatient medications prior to visit.   No facility-administered medications prior to visit.     Allergies  Allergen Reactions  . Penicillins     ROS Review of Systems  Constitutional: Negative for appetite change, chills, fatigue and fever.  HENT: Negative for congestion, dental problem, ear pain and sore throat.   Eyes: Negative for discharge, redness and visual disturbance.  Respiratory: Negative for cough, chest tightness, shortness of breath and wheezing.   Cardiovascular: Negative for chest pain, palpitations and leg swelling.  Gastrointestinal: Negative for abdominal pain, blood in stool, diarrhea, nausea and vomiting.  Genitourinary: Negative for difficulty urinating, dysuria, flank pain, frequency, hematuria and urgency.  Musculoskeletal: Negative for arthralgias, back pain, joint swelling, myalgias and neck stiffness.  Skin: Negative for pallor and rash.  Neurological: Positive for weakness (mild, + L calf muscle atrophy). Negative for dizziness, speech difficulty and headaches.  Hematological: Negative for adenopathy. Does not bruise/bleed easily.  Psychiatric/Behavioral: Negative  for confusion and sleep disturbance. The patient is not nervous/anxious.     PE; Blood pressure 119/68, pulse 77, temperature 97.9 F (36.6 C), temperature source Oral, resp. rate 16, height '5\' 9"'$  (1.753 m), weight 176 lb 4 oz (79.9 kg), SpO2 96 %. Gen: Alert, well appearing.  Patient is oriented to person, place, time, and situation. AFFECT: pleasant, lucid thought and speech. ENT: Ears: EACs clear, normal epithelium.  TMs with good light reflex and landmarks bilaterally.  Eyes: no injection, icteris, swelling, or exudate.  EOMI, PERRLA. Nose: no drainage or turbinate edema/swelling.  No injection or focal lesion.  Mouth:  lips without lesion/swelling.  Oral mucosa pink and moist.  Dentition intact and without obvious caries or gingival swelling.  Oropharynx without erythema, exudate, or swelling.  Neck: supple/nontender.  No LAD, mass, or TM.  Carotid pulses 2+ bilaterally, without bruits. CV: RRR, no m/r/g.   LUNGS: CTA bilat, nonlabored resps, good aeration in all lung fields. ABD: soft, NT, ND, BS normal.  No hepatospenomegaly or mass.  No bruits. EXT: no clubbing, cyanosis, or edema.  Left calf muscle atrophy noted. Musculoskeletal: no joint swelling, erythema, warmth, or tenderness.  ROM of all joints intact. Skin - no sores or suspicious lesions or rashes or color changes Rectal exam: negative without mass, lesions or tenderness, PROSTATE EXAM: smooth and symmetric without nodules or tenderness, upper normal in size.   Pertinent labs:  Lab Results  Component Value Date   TSH 2.37 02/03/2017   Lab Results  Component Value Date   WBC 5.7 02/03/2017   HGB 14.3 02/03/2017   HCT 44.7 02/03/2017   MCV 87.2 02/03/2017   PLT 217.0 02/03/2017   Lab Results  Component Value Date   CREATININE 1.13 02/03/2017   BUN 19 02/03/2017   NA 140 02/03/2017   K 4.3 02/03/2017   CL 103 02/03/2017   CO2 32 02/03/2017   Lab Results  Component Value Date   ALT 20 02/03/2017   AST 21 02/03/2017   ALKPHOS 55 02/03/2017   BILITOT 0.8 02/03/2017   Lab Results  Component Value Date   CHOL 213 (H) 02/03/2017   Lab Results  Component Value Date   HDL 57.20 02/03/2017   Lab Results  Component Value Date   LDLCALC 140 (H) 02/03/2017   Lab Results  Component Value Date   TRIG 75.0 02/03/2017   Lab Results  Component Value Date   CHOLHDL 4 02/03/2017   Lab Results  Component Value Date   PSA 3.10 02/03/2017   PSA 1.03 04/18/2015    ASSESSMENT AND PLAN:   1) Health maintenance exam: Reviewed age and gender appropriate health maintenance issues (prudent diet, regular exercise, health risks of  tobacco and excessive alcohol, use of seatbelts, fire alarms in home, use of sunscreen).  Also reviewed age and gender appropriate health screening as well as vaccine recommendations. Vaccines: UTD, including flu.  Shingrix #1 today. Labs: reviewed recent fasting labs today: all good except PSA (see below). Prostate ca screening: DRE normal , PSA was in normal range but went up from 1 to 3 over the last year. Discussed option of repeat PSA, total and free in 6 mo vs urology referral now.  He favored urology referral now, esp since he thinks his father had prostate cancer (he cannot be 762% certain of this today). Colon ca screening: next colonoscopy due 2027.  2) Left calf atrophy/weakness--as noted at most recent visit 2 days ago. Suspect due to chronic lumbar  radiculopathy with myelopathy.  Neuro referral is in the works.  An After Visit Summary was printed and given to the patient.  FOLLOW UP:  Return for as needed.  Signed:  Crissie Sickles, MD           02/04/2017

## 2017-02-15 ENCOUNTER — Encounter: Payer: Self-pay | Admitting: Family Medicine

## 2017-02-15 ENCOUNTER — Ambulatory Visit (HOSPITAL_BASED_OUTPATIENT_CLINIC_OR_DEPARTMENT_OTHER)
Admission: RE | Admit: 2017-02-15 | Discharge: 2017-02-15 | Disposition: A | Discharge status: Home / Self Care | Payer: 59 | Source: Ambulatory Visit | Attending: Family Medicine | Admitting: Family Medicine

## 2017-02-15 ENCOUNTER — Telehealth: Payer: Self-pay | Admitting: Family Medicine

## 2017-02-15 DIAGNOSIS — M5442 Lumbago with sciatica, left side: Secondary | ICD-10-CM | POA: Diagnosis present

## 2017-02-15 DIAGNOSIS — M5137 Other intervertebral disc degeneration, lumbosacral region: Secondary | ICD-10-CM | POA: Diagnosis not present

## 2017-02-15 DIAGNOSIS — M5136 Other intervertebral disc degeneration, lumbar region: Secondary | ICD-10-CM | POA: Diagnosis not present

## 2017-02-15 DIAGNOSIS — R29898 Other symptoms and signs involving the musculoskeletal system: Secondary | ICD-10-CM

## 2017-02-15 DIAGNOSIS — G8929 Other chronic pain: Secondary | ICD-10-CM | POA: Diagnosis not present

## 2017-02-15 DIAGNOSIS — R531 Weakness: Secondary | ICD-10-CM | POA: Diagnosis not present

## 2017-02-15 DIAGNOSIS — M545 Low back pain: Secondary | ICD-10-CM | POA: Diagnosis not present

## 2017-02-15 DIAGNOSIS — M62562 Muscle wasting and atrophy, not elsewhere classified, left lower leg: Secondary | ICD-10-CM | POA: Diagnosis not present

## 2017-02-15 NOTE — Telephone Encounter (Signed)
Called in for x-ray results.   Given:  Back x-ray showed only some mild "wear and tear" arthritis called osteoarthritis.   This is what I expected to see.   No new recommendations at this time. Stated he was referred to a neurologist due to his left calf muscle not working.   X-ray results would be available for the neurologist.   No additional questions or concerns.

## 2017-05-09 ENCOUNTER — Ambulatory Visit (INDEPENDENT_AMBULATORY_CARE_PROVIDER_SITE_OTHER): Payer: 59

## 2017-05-09 DIAGNOSIS — Z23 Encounter for immunization: Secondary | ICD-10-CM

## 2017-05-09 DIAGNOSIS — R972 Elevated prostate specific antigen [PSA]: Secondary | ICD-10-CM | POA: Diagnosis not present

## 2017-05-09 DIAGNOSIS — M5418 Radiculopathy, sacral and sacrococcygeal region: Secondary | ICD-10-CM | POA: Diagnosis not present

## 2017-05-09 DIAGNOSIS — N400 Enlarged prostate without lower urinary tract symptoms: Secondary | ICD-10-CM | POA: Diagnosis not present

## 2017-05-09 NOTE — Progress Notes (Signed)
Pt presented today for his 2nd shingrix vaccine. Pt tolerated injection well.

## 2017-07-25 ENCOUNTER — Encounter: Payer: Self-pay | Admitting: Family Medicine

## 2017-07-25 ENCOUNTER — Ambulatory Visit (INDEPENDENT_AMBULATORY_CARE_PROVIDER_SITE_OTHER): Payer: 59 | Admitting: Family Medicine

## 2017-07-25 ENCOUNTER — Ambulatory Visit: Payer: Self-pay

## 2017-07-25 VITALS — BP 108/77 | HR 103 | Temp 97.9°F | Ht 69.0 in | Wt 165.4 lb

## 2017-07-25 DIAGNOSIS — R110 Nausea: Secondary | ICD-10-CM | POA: Diagnosis not present

## 2017-07-25 DIAGNOSIS — R1032 Left lower quadrant pain: Secondary | ICD-10-CM

## 2017-07-25 DIAGNOSIS — K5792 Diverticulitis of intestine, part unspecified, without perforation or abscess without bleeding: Secondary | ICD-10-CM | POA: Diagnosis not present

## 2017-07-25 LAB — POCT URINALYSIS DIPSTICK
BILIRUBIN UA: NEGATIVE
Blood, UA: NEGATIVE
Glucose, UA: NEGATIVE
Nitrite, UA: NEGATIVE
SPEC GRAV UA: 1.025 (ref 1.010–1.025)
Urobilinogen, UA: 0.2 E.U./dL
pH, UA: 6 (ref 5.0–8.0)

## 2017-07-25 MED ORDER — PROMETHAZINE HCL 12.5 MG PO TABS: ORAL_TABLET | ORAL | 0 refills | Status: DC

## 2017-07-25 MED ORDER — OXYCODONE-ACETAMINOPHEN 5-325 MG PO TABS: ORAL_TABLET | ORAL | 0 refills | Status: DC

## 2017-07-25 NOTE — Progress Notes (Signed)
OFFICE VISIT  07/25/2017   CC:  Chief Complaint  Patient presents with  . Abdominal Pain    pt c/o sharp flank pain that radiates to his left lower quandrant X 4days. rated 10/10.   HPI:    Patient is a 52 y.o. Caucasian male who presents accompanied by his wife for abdominal pain. Onset 3 days ago, L side pain, started radiating into LLQ of abdomen.  Now pretty much just hurting in LLQ, not in side much. Constant pain, some periods of worsening, esp when he tries eating. Poor appetite. Eating makes it hurt much more.  Nausea since getting on cipro and flagyl and opioids recently at local UC--for this complaint.  No vomiting.  No diarrhea.  Most recent BM was 3 d/a--no constipation prior. Currently says he has not had the pain in about 3 hours--the longest he has been on it since this started 3 d/a.  No fevers.  No blood in urine, no dysuria.   Says pain character is deep and sharp.  No groin pain.  No rash in the area.  No scrotal swelling.  No abd distention.  UC in [facility] saw him yesterday and dx'd him with diverticulitis and rx'd cipro/flagyl and pain meds. Was told to f/u here if not better.  He has just taken a dose of his abx last night and also this morning.  Not passing gas.  Denies abd distention. Has hx of some episodic LBP, feels mild R sided back pain but nothing very new for him.   Past Medical History:  Diagnosis Date  . Allergy   . Asthma   . DDD (degenerative disc disease), lumbar    Mild DJD of L spine on x-ray 01/2017.  Marland Kitchen Eczema   . Hyperlipidemia   . Left calf atrophy 01/2017   with mild weakness of L calf--referred to neuro  . Seasonal allergies     Past Surgical History:  Procedure Laterality Date  . COLONOSCOPY  2017   Medoff:    Outpatient Medications Prior to Visit  Medication Sig Dispense Refill  . ciprofloxacin (CIPRO) 500 MG tablet     . metroNIDAZOLE (FLAGYL) 500 MG tablet     . oxyCODONE-acetaminophen (PERCOCET/ROXICET) 5-325 MG tablet       No facility-administered medications prior to visit.     Allergies  Allergen Reactions  . Penicillins     ROS As per HPI  PE: Blood pressure 108/77, pulse (!) 103, temperature 97.9 F (36.6 C), temperature source Oral, height  (1.753 m), weight 165 lb 6.4 oz (75 kg), SpO2 95 %. Gen: Alert, tired appearing but NAD.  Patient is oriented to person, place, time, and situation. ZOX:WRUE: no injection, icteris, swelling, or exudate.  EOMI, PERRLA. Mouth: lips without lesion/swelling.  Oral mucosa pink and moist. Oropharynx without erythema, exudate, or swelling.  Neck - No masses or thyromegaly or limitation in range of motion CV: RRR, no m/r/g.  Radial and femoral pulses 2+ bilat. LUNGS: CTA bilat, nonlabored resps, good aeration in all lung fields. ABD: soft, no distention, BS normal, no HSM or bruit or mass.  He has moderate LLQ TTP but no guarding and no rebound tenderness.  No tenderness in L side/flank. No groin tenderness or swelling.    LABS:   CC UA today here shows large LEU, no blood, trace protein, SG 1.025, otherwise all normal.  IMPRESSION AND PLAN:  Acute LLQ pain: seems to have stabilized in the last 6 hours or so.  I agree with working dx of acute diverticulitis and we considered a CT abd today but chose to hold off since he just started feeling better and he has only had 2 doses of his abx.  No sign of acute abdomen. UA abnl but no blood.  Already on abx.  Sent urine for c/s but UTI doubtful. Plan: check CBC w/diff and BMET today. Add promethazine 12.5mg , 1-2 q6h prn nausea. Continue cipro and flagyl and continue percocet 5/325 as needed.  Rx for #15 of percocet 5/325 given today.  Spent 40 min with pt today, with >50% of this time spent in counseling and care coordination regarding the above problems.  An After Visit Summary was printed and given to the patient.  FOLLOW UP: Return in about 2 days (around 07/27/2017) for this wednesday--f/u LLQ pain. He and  his wife know that he needs to call 911 or proceed to the nearest ER if he gets worse prior to coming back to see me.  Signed:  Santiago Bumpers, MD           07/25/2017

## 2017-07-25 NOTE — Telephone Encounter (Signed)
Saw pt in office today

## 2017-07-25 NOTE — Telephone Encounter (Signed)
Pt.'s wife called to report pt. Was seen at [facility] for left lower abdominal pain.Started on "2 antibiotics and a pain pill. He is still having pain." No Bm since Friday. No vomiting or fever. States "his vital signs have been good." Appointment made for today.  Reason for Disposition . [1] MODERATE pain (e.g., interferes with normal activities) AND [2] pain comes and goes (cramps) AND [3] present > 24 hours  (Exception: pain with Vomiting or Diarrhea - see that Guideline)  Answer Assessment - Initial Assessment Questions 1. LOCATION: "Where does it hurt?"      Left lower quadrant 2. RADIATION: "Does the pain shoot anywhere else?" (e.g., chest, back)     To back 3. ONSET: "When did the pain begin?" (Minutes, hours or days ago)      The weekend 4. SUDDEN: "Gradual or sudden onset?"     Sudden 5. PATTERN "Does the pain come and go, or is it constant?"    - If constant: "Is it getting better, staying the same, or worsening?"      (Note: Constant means the pain never goes away completely; most serious pain is constant and it progresses)     - If intermittent: "How long does it last?" "Do you have pain now?"     (Note: Intermittent means the pain goes away completely between bouts)     Comes and goes 6. SEVERITY: "How bad is the pain?"  (e.g., Scale 1-10; mild, moderate, or severe)    - MILD (1-3): doesn't interfere with normal activities, abdomen soft and not tender to touch     - MODERATE (4-7): interferes with normal activities or awakens from sleep, tender to touch     - SEVERE (8-10): excruciating pain, doubled over, unable to do any normal activities       Moderate 7. RECURRENT SYMPTOM: "Have you ever had this type of abdominal pain before?" If so, ask: "When was the last time?" and "What happened that time?"      No 8. CAUSE: "What do you think is causing the abdominal pain?"     Diverticulitis  9. RELIEVING/AGGRAVATING FACTORS: "What makes it better or worse?" (e.g., movement, antacids,  bowel movement)     Pain med helps a little 10. OTHER SYMPTOMS: "Has there been any vomiting, diarrhea, constipation, or urine problems?"       No BM since Friday  Protocols used: ABDOMINAL PAIN - MALE-A-AH

## 2017-07-25 NOTE — Telephone Encounter (Signed)
Pt has apt for today at 3:30pm w/ Dr. Milinda Cave.

## 2017-07-26 LAB — BASIC METABOLIC PANEL
BUN: 14 mg/dL (ref 6–23)
CALCIUM: 9.7 mg/dL (ref 8.4–10.5)
CHLORIDE: 101 meq/L (ref 96–112)
CO2: 30 meq/L (ref 19–32)
Creatinine, Ser: 1.06 mg/dL (ref 0.40–1.50)
GFR: 77.53 mL/min (ref >60.00)
Glucose, Bld: 120 mg/dL — ABNORMAL HIGH (ref 70–99)
Potassium: 4.5 mEq/L (ref 3.5–5.1)
SODIUM: 138 meq/L (ref 135–145)

## 2017-07-26 LAB — CBC WITH DIFFERENTIAL/PLATELET
BASOS ABS: 0.3 10*3/uL — AB (ref 0.0–0.1)
Basophils Relative: 2.8 % (ref 0.0–3.0)
EOS ABS: 0 10*3/uL (ref 0.0–0.7)
Eosinophils Relative: 0.4 % (ref 0.0–5.0)
HCT: 45.7 % (ref 39.0–52.0)
Hemoglobin: 15.1 g/dL (ref 13.0–17.0)
LYMPHS ABS: 1.1 10*3/uL (ref 0.7–4.0)
Lymphocytes Relative: 9.8 % — ABNORMAL LOW (ref 12.0–46.0)
MCHC: 33.1 g/dL (ref 30.0–36.0)
MCV: 85.9 fl (ref 78.0–100.0)
MONO ABS: 1 10*3/uL (ref 0.1–1.0)
MONOS PCT: 8.1 % (ref 3.0–12.0)
NEUTROS PCT: 78.9 % — AB (ref 43.0–77.0)
Neutro Abs: 9.3 10*3/uL — ABNORMAL HIGH (ref 1.4–7.7)
Platelets: 207 10*3/uL (ref 150.0–400.0)
RBC: 5.32 Mil/uL (ref 4.22–5.81)
RDW: 14.3 % (ref 11.5–15.5)
WBC: 11.8 10*3/uL — AB (ref 4.0–10.5)

## 2017-07-26 LAB — URINE CULTURE
MICRO NUMBER:: 90549652
RESULT: NO GROWTH
SPECIMEN QUALITY: ADEQUATE

## 2017-07-27 ENCOUNTER — Ambulatory Visit: Payer: 59 | Admitting: Family Medicine

## 2017-07-27 ENCOUNTER — Other Ambulatory Visit: Payer: Self-pay | Admitting: Family Medicine

## 2017-07-27 MED ORDER — METRONIDAZOLE 500 MG PO TABS: 500.0000 mg | ORAL_TABLET | Freq: Three times a day (TID) | ORAL | 0 refills | Status: DC

## 2017-07-27 MED ORDER — CIPROFLOXACIN HCL 500 MG PO TABS
500.0000 mg | ORAL_TABLET | Freq: Two times a day (BID) | ORAL | 0 refills | Status: DC
Start: 2017-07-27 — End: 2018-02-10

## 2017-09-06 DIAGNOSIS — M5116 Intervertebral disc disorders with radiculopathy, lumbar region: Secondary | ICD-10-CM | POA: Diagnosis not present

## 2017-09-06 DIAGNOSIS — M48061 Spinal stenosis, lumbar region without neurogenic claudication: Secondary | ICD-10-CM | POA: Diagnosis not present

## 2017-09-06 DIAGNOSIS — M5418 Radiculopathy, sacral and sacrococcygeal region: Secondary | ICD-10-CM | POA: Diagnosis not present

## 2017-09-06 DIAGNOSIS — M4726 Other spondylosis with radiculopathy, lumbar region: Secondary | ICD-10-CM | POA: Diagnosis not present

## 2017-10-31 ENCOUNTER — Telehealth: Payer: Self-pay | Admitting: Family Medicine

## 2017-10-31 DIAGNOSIS — M5442 Lumbago with sciatica, left side: Principal | ICD-10-CM

## 2017-10-31 DIAGNOSIS — M5418 Radiculopathy, sacral and sacrococcygeal region: Secondary | ICD-10-CM

## 2017-10-31 DIAGNOSIS — R29898 Other symptoms and signs involving the musculoskeletal system: Secondary | ICD-10-CM

## 2017-10-31 DIAGNOSIS — G8929 Other chronic pain: Secondary | ICD-10-CM

## 2017-10-31 DIAGNOSIS — R202 Paresthesia of skin: Secondary | ICD-10-CM

## 2017-10-31 NOTE — Telephone Encounter (Signed)
Copied from CRM [id number]. Topic: Referral - Request >> Oct 31, 2017 11:43 AM Arlyss Gandyichardson, Taren N, NT wrote: Reason for CRM: Vernona RiegerLaura, pts wife, calling to request a referral to [facility]. She stats they are not happy with his current neurologist office.

## 2017-11-04 ENCOUNTER — Encounter: Payer: Self-pay | Admitting: Family Medicine

## 2017-11-04 NOTE — Telephone Encounter (Signed)
OK, referral ordered per pt request 

## 2017-11-07 NOTE — Telephone Encounter (Signed)
LM for patient to contact [facility] [phone]. [facility]

## 2017-11-08 ENCOUNTER — Encounter: Payer: Self-pay | Admitting: Neurology

## 2018-01-29 DIAGNOSIS — H109 Unspecified conjunctivitis: Secondary | ICD-10-CM | POA: Diagnosis not present

## 2018-02-10 ENCOUNTER — Ambulatory Visit (INDEPENDENT_AMBULATORY_CARE_PROVIDER_SITE_OTHER): Payer: 59 | Admitting: Neurology

## 2018-02-10 ENCOUNTER — Encounter: Payer: Self-pay | Admitting: Neurology

## 2018-02-10 ENCOUNTER — Other Ambulatory Visit (INDEPENDENT_AMBULATORY_CARE_PROVIDER_SITE_OTHER): Payer: 59

## 2018-02-10 VITALS — BP 104/70 | HR 61 | Ht 69.0 in | Wt 172.4 lb

## 2018-02-10 DIAGNOSIS — M62562 Muscle wasting and atrophy, not elsewhere classified, left lower leg: Secondary | ICD-10-CM

## 2018-02-10 DIAGNOSIS — G8929 Other chronic pain: Secondary | ICD-10-CM | POA: Diagnosis not present

## 2018-02-10 DIAGNOSIS — M5442 Lumbago with sciatica, left side: Secondary | ICD-10-CM

## 2018-02-10 DIAGNOSIS — R292 Abnormal reflex: Secondary | ICD-10-CM

## 2018-02-10 LAB — SEDIMENTATION RATE: Sed Rate: 15 mm/hr (ref 0–20)

## 2018-02-10 LAB — TSH: TSH: 2.15 u[IU]/mL (ref 0.35–4.50)

## 2018-02-10 LAB — VITAMIN B12: VITAMIN B 12: 255 pg/mL (ref 211–911)

## 2018-02-10 NOTE — Patient Instructions (Signed)
Check labs  MRI thoracic spine wwo contrast  NCS/EMG of the left > right leg

## 2018-02-10 NOTE — Progress Notes (Signed)
[facility] Clinic Note - Initial Visit   Date: 02/10/18  Troy Taylor MRN: [id number] DOB: 09/15/1965   Dear Troy Taylor:  Thank you for your kind referral of Troy Taylor for consultation of left calf atrophy and back pain. Although his history is well known to you, please allow Troy Taylor to reiterate it for the purpose of our medical record. The patient was accompanied to the clinic by wife who also provides collateral information.     History of Present Illness: Troy Taylor is a 52 y.o. left-handed Caucasian male presenting for evaluation of left calf atrophy and low back pain.    He has a history of chronic low back pain which initially started after an inner tubing injury in early 2000.   Since this time he has chronic low back pain, described as sharp pain localized to his low back and triggered by trunk flexion, such as picking something off the floor.  Several years later, he developed low back pain with associated with left foot eversion and numbness and tingling of the left leg/foot.  Pain would act up every 4-5 months and last about 2 days.  He usually treated pain with aspirin.   Since summer 2018, his son noticed that he has atrophy of the left calf.   He feels that his left leg is generally weaker than the right.  He has not suffered any falls.  He has rare spells of left thigh cramps.  No muscle twitches.  No arm weakness.   He has seen Dr. Tommas Taylor at [facility] in the February 2019 for the same complaints who ordered MRI lumbar spine which showed disc protrusion at the right lateral recess at L4-5 causing right L5 nerve root compression.  There was no nerve impingement to explain his left leg symptoms.  He has not had electrodiagnostic testing.  Out-side paper records, electronic medical record, and images have been reviewed where available and summarized as:  MRI lumbar spine 09/06/2017 performed at [facility]: 1.  Short  pedicles and mild degenerative disc facet arthritis causes some foraminal stenosis at multiple levels, greatest at the lower 3 lumbar levels with root contact and/for mild compression.  Lateral recess narrowing with crowding and deviation of transverse was without high-grade root compression, except as below. 2.  Disc protrusion in the right lateral recess at L4-5 additionally causes for the right L5 root compression.  Past Medical History:  Diagnosis Date  . Allergy   . Asthma   . DDD (degenerative disc disease), lumbar    Mild DJD of L spine on x-ray 01/2017.  Marland Kitchen Eczema   . Hyperlipidemia   . Left calf atrophy 01/2017   with mild weakness of L calf--referred to neuro  . Sacral radiculopathy    Chronic LBP with L leg paresthesias and weakness.  [facility] 2019--planned on MRI L/S spine and EMGs--unclear if pt ever got these.  Pt requested referral to [facility] AFTER seeing [facility].  . Seasonal allergies     Past Surgical History:  Procedure Laterality Date  . COLONOSCOPY  2017   Medoff:  . Wisdon teeth extraction        Medications:  Outpatient Encounter Medications as of 02/10/2018  Medication Sig  . [DISCONTINUED] ciprofloxacin (CIPRO) 500 MG tablet Take 1 tablet (500 mg total) by mouth 2 (two) times daily.  . [DISCONTINUED] metroNIDAZOLE (FLAGYL) 500 MG tablet Take 1 tablet (500 mg total) by mouth 3 (three) times daily.  . [DISCONTINUED]  oxyCODONE-acetaminophen (PERCOCET/ROXICET) 5-325 MG tablet 1-2 tabs po q6h prn pain  . [DISCONTINUED] promethazine (PHENERGAN) 12.5 MG tablet 1-2 tabs po q6h prn nausea   No facility-administered encounter medications on file as of 02/10/2018.      Allergies:  Allergies  Allergen Reactions  . Penicillins     Family History: Family History  Problem Relation Age of Onset  . Diabetes Mother   . Hypertension Mother   . Arthritis Mother   . Renal cancer Father 30  . Lymphoma Father   . BRCA 1/2 Father        BRCA2  mutation positive  . Other Father        CHEK2 mutation positive  . Asthma Father   . Allergies Father   . Breast cancer Paternal Grandmother 77       d.60  . Uterine cancer Paternal Grandmother 92  . Cancer Paternal Grandfather        unknown type    Social History: Social History   Tobacco Use  . Smoking status: Never Smoker  . Smokeless tobacco: Never Used  Substance Use Topics  . Alcohol use: No  . Drug use: No   Social History   Social History Narrative   Married, 2 sons.   Educ:college   Occup: "banker/financial advisor" - Troy Taylor   Tob: never.   Alc: rare   Drugs none.   Lives with wife in a 2 story home.      Review of Systems:  CONSTITUTIONAL: No fevers, chills, night sweats, or weight loss.   EYES: No visual changes or eye pain ENT: No hearing changes.  No history of nose bleeds.   RESPIRATORY: No cough, wheezing and shortness of breath.   CARDIOVASCULAR: Negative for chest pain, and palpitations.   GI: Negative for abdominal discomfort, blood in stools or black stools.  No recent change in bowel habits.   GU:  No history of incontinence.   MUSCLOSKELETAL: +history of joint pain No swelling.  No myalgias.   SKIN: Negative for lesions, rash, and itching.   HEMATOLOGY/ONCOLOGY: Negative for prolonged bleeding, bruising easily, and swollen nodes.  No history of cancer.   ENDOCRINE: Negative for cold or heat intolerance, polydipsia or goiter.   PSYCH:  No depression or anxiety symptoms.   NEURO: As Above.   Vital Signs:  BP 104/70   Pulse 61   Ht '5\' 9"'$  (1.753 m)   Wt 172 lb 6 oz (78.2 kg)   SpO2 99%   BMI 25.46 kg/m    General Medical Exam:   General:  Well appearing, comfortable.   Eyes/ENT: see cranial nerve examination.   Neck: No masses appreciated.  Full range of motion without tenderness.  No carotid bruits. Respiratory:  Clear to auscultation, good air entry bilaterally.   Cardiac:  Regular rate and rhythm, no murmur.   Extremities:   No deformities, edema, or skin discoloration.  Skin:  No rashes or lesions.  Neurological Exam: MENTAL STATUS including orientation to time, place, person, recent and remote memory, attention span and concentration, language, and fund of knowledge is normal.  Speech is not dysarthric.  CRANIAL NERVES: II:  No visual field defects.  Unremarkable fundi.   III-IV-VI: Pupils equal round and reactive to light.  Normal conjugate, extra-ocular eye movements in all directions of gaze.  No nystagmus.  No ptosis.   V:  Normal facial sensation.     VII:  Normal facial symmetry and movements.  No pathologic facial reflexes.  VIII:  Normal hearing and vestibular function.   IX-X:  Normal palatal movement.   XI:  Normal shoulder shrug and head rotation.   XII:  Normal tongue strength and range of motion, no deviation or fasciculation.  MOTOR:  Moderate left gastrocnemius muscle atrophy, most notably over the medial head.  There is also mild atrophy over the right TA.  No pronator drift.  Tone is normal.    Right Upper Extremity:    Left Upper Extremity:    Deltoid  5/5   Deltoid  5/5   Biceps  5/5   Biceps  5/5   Triceps  5/5   Triceps  5/5   Wrist extensors  5/5   Wrist extensors  5/5   Wrist flexors  5/5   Wrist flexors  5/5   Finger extensors  5/5   Finger extensors  5/5   Finger flexors  5/5   Finger flexors  5/5   Dorsal interossei  5/5   Dorsal interossei  5/5   Abductor pollicis  5/5   Abductor pollicis  5/5   Tone (Ashworth scale)  0  Tone (Ashworth scale)  0   Right Lower Extremity:    Left Lower Extremity:    Hip flexors  5/5   Hip flexors  4/5   Hip extensors  5/5   Hip extensors  4/5   Adductor 5/5  Adductor 4/5  Abductor 5/5  Abductor  4+/5  Knee flexors  5/5   Knee flexors  4/5   Knee extensors  5/5   Knee extensors  5/5   Dorsiflexors  5/5   Dorsiflexors  4+/5   Plantarflexors  5/5   Plantarflexors  4/5   Toe extensors  5/5   Toe extensors  4/5   Toe flexors  5/5   Toe  flexors  4/5   Tone (Ashworth scale)  0  Tone (Ashworth scale)  0   MSRs:  Right                                                                 Left brachioradialis 2+  brachioradialis 2+  biceps 2+  biceps 2+  triceps 2+  triceps 2+  patellar 3+  patellar 3+  ankle jerk 2+  ankle jerk 0  plantar response down  plantar response down   SENSORY:  Normal and symmetric perception of light touch, pinprick, vibration, and proprioception.  Romberg's sign absent.   COORDINATION/GAIT: Normal finger-to- nose-finger.  Intact rapid alternating movements bilaterally.  Able to rise from a chair without using arms.  Gait narrow based and stable.  He is able to perform tandem gait, heel walking, and has slight difficulty with toe walking on the left.    IMPRESSION: Mr. Whilden is a very pleasant 51 year-old man referred for evaluation of insidious low back pain and left calf atrophy.  His exam shows generalized pattern of weakness in the left leg, not only isolated to the calf muscles.  I do not see any fasciculations or abnormal movements.  He patella reflexes are brisk and cannot be explained by his MRI lumbar spine, as there is no spinal canal stenosis.  Imaging shows right L5 nerve root compression by disc protrusion at the right lateral recess at L4-L5.  There is no findings to explain left radicular pain/weakness.  With combination of weakness and brisk reflexes, I will check MRI thoracic spine wo contrast to look for compressive myelopathy.  Examination of the arms is normal, making cervical lesion less likely.  If his thoracic spine is normal, however, we may need to imaging his cervical spine and brain.  While investigating his upper motor neuron findings, I will also pursue work-up for his diffuse leg weakness which may stem from peripheral nerve lesion with NCS/EMG of the left > right leg to better localize symptoms. Check CK, aldolase, ESR, CRP, TSH, vitamin B12, copper, SPEP with IFE, PTH Discussed  work-up and potential causes for symptoms in detail.  All questions were answered  Return to clinic after testing   Thank you for allowing me to participate in patient's care.  If I can answer any additional questions, I would be pleased to do so.    Sincerely,    Donika K. Posey Pronto, DO

## 2018-02-14 ENCOUNTER — Telehealth: Payer: Self-pay | Admitting: *Deleted

## 2018-02-14 LAB — PROTEIN ELECTROPHORESIS, SERUM
Albumin ELP: 4.6 g/dL (ref 3.8–4.8)
Alpha 1: 0.3 g/dL (ref 0.2–0.3)
Alpha 2: 0.6 g/dL (ref 0.5–0.9)
BETA 2: 0.4 g/dL (ref 0.2–0.5)
Beta Globulin: 0.4 g/dL (ref 0.4–0.6)
GAMMA GLOBULIN: 0.9 g/dL (ref 0.8–1.7)
Total Protein: 7.1 g/dL (ref 6.1–8.1)

## 2018-02-14 LAB — IMMUNOFIXATION ELECTROPHORESIS
IGM, SERUM: 36 mg/dL — AB (ref 50–300)
IMMUNOFIX ELECTR INT: NOT DETECTED
IMMUNOGLOBULIN A: 287 mg/dL (ref 47–310)
IgG (Immunoglobin G), Serum: 991 mg/dL (ref 600–1640)

## 2018-02-14 LAB — PARATHYROID HORMONE, INTACT (NO CA): PTH: 24 pg/mL (ref 14–64)

## 2018-02-14 LAB — CK: Total CK: 135 U/L (ref 44–196)

## 2018-02-14 LAB — ALDOLASE: Aldolase: 4.6 U/L (ref <=8.1)

## 2018-02-14 LAB — COPPER, SERUM: COPPER: 82 ug/dL (ref 70–175)

## 2018-02-14 NOTE — Telephone Encounter (Signed)
-----   Message from Glendale Chardonika K Patel, DO sent at 02/14/2018 11:08 AM EST ----- Please inform patient that all his labs look good, except his vitamin B12 level is low-normal and although I doubt it is causing his leg weakenss, starting supplementation with 1000mcg daily may help overall energy. Thanks.

## 2018-02-14 NOTE — Telephone Encounter (Signed)
Patient given results and instructions.   

## 2018-02-19 ENCOUNTER — Encounter: Payer: Self-pay | Admitting: Family Medicine

## 2018-03-14 ENCOUNTER — Ambulatory Visit
Admission: RE | Admit: 2018-03-14 | Discharge: 2018-03-14 | Disposition: A | Discharge status: Home / Self Care | Payer: 59 | Source: Ambulatory Visit | Attending: Neurology | Admitting: Neurology

## 2018-03-14 ENCOUNTER — Ambulatory Visit (INDEPENDENT_AMBULATORY_CARE_PROVIDER_SITE_OTHER): Payer: 59 | Admitting: Neurology

## 2018-03-14 ENCOUNTER — Encounter: Payer: Self-pay | Admitting: Family Medicine

## 2018-03-14 DIAGNOSIS — M62562 Muscle wasting and atrophy, not elsewhere classified, left lower leg: Secondary | ICD-10-CM

## 2018-03-14 DIAGNOSIS — R292 Abnormal reflex: Secondary | ICD-10-CM | POA: Diagnosis not present

## 2018-03-14 DIAGNOSIS — M5442 Lumbago with sciatica, left side: Secondary | ICD-10-CM

## 2018-03-14 DIAGNOSIS — G8929 Other chronic pain: Secondary | ICD-10-CM

## 2018-03-14 DIAGNOSIS — M5124 Other intervertebral disc displacement, thoracic region: Secondary | ICD-10-CM | POA: Diagnosis not present

## 2018-03-14 HISTORY — PX: OTHER SURGICAL HISTORY: SHX169

## 2018-03-14 NOTE — Procedures (Signed)
[facility]  [address], [address]  [place], [place] [address] Tel: [phone] Fax:  [phone] Test Date:  03/14/2018  Patient: Troy FieldRichard Arzola DOB: 03/30/1965 Physician: Nita Sickleonika Patel, DO  Sex: Male Height: 5\' 9"  Ref Phys: Nita Sickleonika Patel, DO  ID#: [id number] Temp: 32.0C Technician:    Patient Complaints: This is a 52 year old man referred for evaluation of left leg weakness and atrophy.  NCV & EMG Findings: Extensive electrodiagnostic testing of the left lower extremity and additional studies of the right shows:  1. Bilateral sural and superficial peroneal sensory responses are within normal limits. 2. Bilateral peroneal and tibial motor responses are within normal limits.  Of note, there is evidence of a left accessory peroneal nerve, normal variant. 3. Bilateral tibial H reflex latencies are within normal limits. 4. Chronic motor axonal loss changes are seen affecting the left medial gastrocnemius, and to a lesser degree in the tibialis anterior, flexor digitorum longus, rectus femoris, and biceps femoris short head muscles.  Active denervation is isolated to the medial gastrocnemius muscle.  Impression: The electrophysiologic findings are suggestive of an active chronic intraspinal canal lesion (i.e. radiculopathy) affecting the left L3-S1 nerve root/segments.  There is no evidence of a large fiber sensorimotor polyneuropathy affecting the legs.   ___________________________ Nita Sickleonika Patel, DO    Nerve Conduction Studies Anti Sensory Summary Table   Site NR Peak (ms) Norm Peak (ms) P-T Amp (V) Norm P-T Amp  Left Sup Peroneal Anti Sensory (Ant Lat Mall)  32C  12 cm    2.8 <4.6 16.3 >4  Right Sup Peroneal Anti Sensory (Ant Lat Mall)  32C  12 cm    3.0 <4.6 15.6 >4  Left Sural Anti Sensory (Lat Mall)  32C  Calf    3.6 <4.6 16.9 >4  Right Sural Anti Sensory (Lat Mall)  32C  Calf    3.3 <4.6 19.0 >4   Motor Summary Table   Site NR Onset (ms) Norm  Onset (ms) O-P Amp (mV) Norm O-P Amp Site1 Site2 Delta-0 (ms) Dist (cm) Vel (m/s) Norm Vel (m/s)  Left Peroneal Motor (Ext Dig Brev)  32C  Ankle    3.9 <6.0 3.3 >2.5 B Fib Ankle 8.6 36.0 42 >40  B Fib    12.5  3.6  Poplt B Fib 1.2 7.0 58 >40  Poplt    13.7  3.5         Medial malleolus    4.6  3.6         Right Peroneal Motor (Ext Dig Brev)  32C  Ankle    3.8 <6.0 4.4 >2.5 B Fib Ankle 8.2 37.0 45 >40  B Fib    12.0  4.1  Poplt B Fib 1.4 8.0 57 >40  Poplt    13.4  4.0         Left Peroneal TA Motor (Tib Ant)  32C  Fib Head    2.7 <4.5 3.9 >3 Poplit Fib Head 1.1 7.0 64 >40  Poplit    3.8  3.9         Right Peroneal TA Motor (Tib Ant)  32C  Fib Head    4.0 <4.5 4.1 >3 Poplit Fib Head 1.2 7.0 58 >40  Poplit    5.2  4.1         Left Tibial Motor (Abd Hall Brev)  32C  Ankle    5.4 <6.0 10.5 >4 Knee Ankle 9.8 42.0 43 >40  Knee    15.2  7.4         Right Tibial Motor (Abd Hall Brev)  32C  Ankle    5.8 <6.0 11.9 >4 Knee Ankle 7.4 40.0 54 >40  Knee    13.2  8.0          F Wave Studies   NR F-Lat (ms) Lat Norm (ms) L-R F-Lat (ms)  Left Tibial (Mrkrs) (Abd Hallucis)  32C     54.54 <55 0.83  Right Tibial (Mrkrs) (Abd Hallucis)  32C     53.71 <55 0.83   H Reflex Studies   NR H-Lat (ms) Lat Norm (ms) L-R H-Lat (ms)  Left Tibial (Gastroc)  32C     34.56 <35 1.36  Right Tibial (Gastroc)  32C     33.20 <35 1.36   EMG   Side Muscle Ins Act Fibs Psw Fasc Number Recrt Dur Dur. Amp Amp. Poly Poly. Comment  Left AntTibialis Nml Nml Nml Nml 1- Rapid Some 1+ Some 1+ Nml Nml N/A  Left Gastroc Nml 1+ Nml Nml SMU Rapid All 1+ All 1+ Nml Nml N/A  Left Flex Dig Long Nml Nml Nml Nml 1- Rapid Some 1+ Some 1+ Nml Nml N/A  Left RectFemoris Nml Nml Nml Nml 1- Rapid Some 1+ Some 1+ Nml Nml N/A  Left GluteusMed Nml Nml Nml Nml Nml Nml Nml Nml Nml Nml Nml Nml N/A  Left Lumbo Parasp Low Nml Nml Nml Nml Nml Nml Nml Nml Nml Nml Nml Nml N/A  Left BicepsFemS Nml Nml Nml Nml 1- Mod-R Some 1+ Some 1+  Nml Nml N/A  Right BicepsFemS Nml Nml Nml Nml Nml Nml Nml Nml Nml Nml Nml Nml N/A  Right AntTibialis Nml Nml Nml Nml Nml Nml Nml Nml Nml Nml Nml Nml N/A  Right Gastroc Nml Nml Nml Nml Nml Nml Nml Nml Nml Nml Nml Nml N/A  Right Flex Dig Long Nml Nml Nml Nml Nml Nml Nml Nml Nml Nml Nml Nml N/A  Right RectFemoris Nml Nml Nml Nml Nml Nml Nml Nml Nml Nml Nml Nml N/A  Right GluteusMed Nml Nml Nml Nml Nml Nml Nml Nml Nml Nml Nml Nml N/A      Waveforms:

## 2018-03-17 ENCOUNTER — Telehealth: Payer: Self-pay | Admitting: *Deleted

## 2018-03-17 NOTE — Telephone Encounter (Signed)
-----   Message from Donika K Patel, DO sent at 03/17/2018  1:14 PM EST ----- Please inform patient that his MRI thoracic spine looks good.  Nerve testing shows generalized pattern of neuropathy affecting the nerves to the muscles, sensory nerves are normal.  Because of his localized weakness of the leg, I do want to look more into this with MRI brain and cervical spine wo contrast - diagnosis - hyperreflexia, left leg weakness.  He did have severe claustrophobia, so please ask if he would need valium or open MRI - or if it needs to be done on separate days.  I will see him back in the office after studies are completed to review.  Thanks. 

## 2018-03-17 NOTE — Telephone Encounter (Signed)
Left message for patient to call me back. 

## 2018-03-20 ENCOUNTER — Telehealth: Payer: Self-pay | Admitting: *Deleted

## 2018-03-20 NOTE — Telephone Encounter (Signed)
-----   Message from Glendale Chardonika K Patel, DO sent at 03/17/2018  1:14 PM EST ----- Please inform patient that his MRI thoracic spine looks good.  Nerve testing shows generalized pattern of neuropathy affecting the nerves to the muscles, sensory nerves are normal.  Because of his localized weakness of the leg, I do want to look more into this with MRI brain and cervical spine wo contrast - diagnosis - hyperreflexia, left leg weakness.  He did have severe claustrophobia, so please ask if he would need valium or open MRI - or if it needs to be done on separate days.  I will see him back in the office after studies are completed to review.  Thanks.

## 2018-03-20 NOTE — Telephone Encounter (Signed)
I spoke with patient and gave him the results and instructions.  Patient would like to call [facility] and find out the price of the MRIs before we schedule them since insurance will be starting over at the beginning of the year.  He will call me back to let me know what he decides.

## 2018-04-07 ENCOUNTER — Telehealth: Payer: Self-pay | Admitting: *Deleted

## 2018-04-07 NOTE — Telephone Encounter (Signed)
-----   Message from Glendale Chard, DO sent at 04/07/2018  8:47 AM EST ----- Please ask him to follow-up with me in 4 months, so I can follow his symptoms. Thanks.

## 2018-04-07 NOTE — Telephone Encounter (Signed)
Chelsea will call to schedule the appointment.

## 2018-04-10 ENCOUNTER — Telehealth: Payer: Self-pay | Admitting: Neurology

## 2018-04-10 ENCOUNTER — Telehealth: Payer: Self-pay | Admitting: *Deleted

## 2018-04-10 NOTE — Telephone Encounter (Signed)
-----   Message from Donika K Patel, DO sent at 04/07/2018  8:47 AM EST ----- Please ask him to follow-up with me in 4 months, so I can follow his symptoms. Thanks. 

## 2018-04-10 NOTE — Telephone Encounter (Signed)
Patient does not want to follow up until all tests are done.  FYI.

## 2018-04-10 NOTE — Telephone Encounter (Signed)
I spoke with patient and he requested for me to sent referral for MRI so that he can call them to check on price.

## 2018-04-10 NOTE — Telephone Encounter (Signed)
Patient called and would like to speak with you regarding his MRI and some questions he has. Please Call. Thanks

## 2018-04-12 ENCOUNTER — Other Ambulatory Visit: Payer: Self-pay | Admitting: *Deleted

## 2018-04-12 DIAGNOSIS — R292 Abnormal reflex: Secondary | ICD-10-CM

## 2018-04-12 DIAGNOSIS — G8929 Other chronic pain: Secondary | ICD-10-CM

## 2018-04-12 DIAGNOSIS — M5442 Lumbago with sciatica, left side: Secondary | ICD-10-CM

## 2018-04-12 DIAGNOSIS — M62562 Muscle wasting and atrophy, not elsewhere classified, left lower leg: Secondary | ICD-10-CM

## 2018-06-07 ENCOUNTER — Other Ambulatory Visit: Payer: Self-pay

## 2018-06-07 ENCOUNTER — Encounter: Payer: Self-pay | Admitting: *Deleted

## 2018-06-07 ENCOUNTER — Ambulatory Visit (INDEPENDENT_AMBULATORY_CARE_PROVIDER_SITE_OTHER): Payer: 59 | Admitting: Family Medicine

## 2018-06-07 ENCOUNTER — Encounter: Payer: Self-pay | Admitting: Family Medicine

## 2018-06-07 VITALS — BP 100/65 | HR 63 | Temp 97.9°F | Resp 16 | Ht 69.0 in | Wt 172.0 lb

## 2018-06-07 DIAGNOSIS — J0100 Acute maxillary sinusitis, unspecified: Secondary | ICD-10-CM

## 2018-06-07 MED ORDER — BENZONATATE 200 MG PO CAPS: 200.0000 mg | ORAL_CAPSULE | Freq: Two times a day (BID) | ORAL | 0 refills | Status: DC | PRN

## 2018-06-07 MED ORDER — CETIRIZINE HCL 10 MG PO TABS: 10.0000 mg | ORAL_TABLET | Freq: Every day | ORAL | 0 refills | Status: DC

## 2018-06-07 MED ORDER — FLUTICASONE PROPIONATE 50 MCG/ACT NA SUSP: 2.0000 | Freq: Every day | NASAL | 0 refills | Status: DC

## 2018-06-07 MED ORDER — DOXYCYCLINE HYCLATE 100 MG PO TABS: 100.0000 mg | ORAL_TABLET | Freq: Two times a day (BID) | ORAL | 0 refills | Status: DC

## 2018-06-07 NOTE — Progress Notes (Signed)
Troy Taylor , 1965-05-29, 53 y.o., male MRN: [id number] Patient Care Team    Relationship Specialty Notifications Start End  McGowen, Adrian Blackwater, MD PCP - General Family Medicine  02/04/17   Richmond Campbell, MD Consulting Physician Gastroenterology  02/02/17   Alda Berthold, [place] Physician Neurology  02/19/18     Chief Complaint  Patient presents with   URI    dry cough, sinus symptoms, HA, no fever, no traveling out of [place] and has not been around anyone who has traveled     Subjective: Pt presents for an OV with complaints of headache and ear pressure of 2 weeks duration.  Associated symptoms include initially hdx, er pressure and then decreased appetite. Over last week he has developed a dry cough. He denies fever, chills, myalgias or shortness of breath. He has a h/o asthma and seasonal allergies. He has not required medications for these in some time. He denies travel or exposure to [place] or any sick contacts.  Pt has tried nothing to ease their symptoms.   Depression screen PHQ 2/9 02/02/2017  Decreased Interest 0  Down, Depressed, Hopeless 0  PHQ - 2 Score 0    Allergies  Allergen Reactions   Penicillins    Social History   Social History Narrative   Married, 2 sons.   Educ:college   Occup: "banker/financial advisor" - [name]   Tob: never.   Alc: rare   Drugs none.   Lives with wife in a 2 story home.     Past Medical History:  Diagnosis Date   Allergy    Asthma    DDD (degenerative disc disease), lumbar    Mild DJD of L spine on x-ray 01/2017.   Eczema    Hyperlipidemia    Left calf atrophy 01/2017   with mild weakness of L calf.  No correlation with MRI L spine findings.  02/2018 NCS/EMG motor changes (radiculopathy) affecting the left L3-S1 nerve root/segments.  MR T spine unremarkable.   Left lumbosacral radiculopathy    Chronic LBP with L leg paresthesias and weakness.  [facility] 2019-->MRI L spine showed disc protrusion at  the right lateral recess at L4-5 causing right L5 nerve root compression.  Ref to Dr. Posey Pronto,  NCS/EMGs + MRI T spine.  NCS/EMG 03/13/18->active chronic intraspinal canal lesion (i.e. radiculopathy)affecting the left L3-S1 nerve root/segments.  T spine MRI unremarkable.   Seasonal allergies    Past Surgical History:  Procedure Laterality Date   COLONOSCOPY  2017   Medoff:   NCS/EMG  03/14/2018   The electrophysiologic findings are suggestive of an active chronic intraspinal canal lesion (i.e. radiculopathy) affecting the left L3-S1 nerve root/segments.  T spine MR unremarkable.   Wisdon teeth extraction      Family History  Problem Relation Age of Onset   Diabetes Mother    Hypertension Mother    Arthritis Mother    Renal cancer Father 65   Lymphoma Father    BRCA 1/2 Father        BRCA2 mutation positive   Other Father        CHEK2 mutation positive   Asthma Father    Allergies Father    Breast cancer Paternal Grandmother 87       d.60   Uterine cancer Paternal Grandmother 67   Cancer Paternal Grandfather        unknown type   Allergies as of 06/07/2018      Reactions   Penicillins  Medication List    as of June 07, 2018 11:51 AM   You have not been prescribed any medications.     All past medical history, surgical history, allergies, family history, immunizations andmedications were updated in the EMR today and reviewed under the history and medication portions of their EMR.     ROS: Negative, with the exception of above mentioned in HPI   Objective:  BP 100/65 (BP Location: Left Arm, Patient Position: Sitting, Cuff Size: Normal)    Pulse 63    Temp 97.9 F (36.6 C) (Oral)    Resp 16    Ht '5\' 9"'$  (1.753 m)    Wt 172 lb (78 kg)    SpO2 99%    BMI 25.40 kg/m  Body mass index is 25.4 kg/m. Gen: Afebrile. No acute distress. Nontoxic in appearance, well developed, well nourished.  HENT: AT. [place]. Bilateral TM visualized w/out erythema, fullness  present bilateral. MMM, no oral lesions. Bilateral nares with erythema, drainage and swelling. Throat without erythema or exudates. PND present. Cough present.  Eyes:Pupils Equal Round Reactive to light, Extraocular movements intact,  Conjunctiva without redness, discharge or icterus. Neck/lymp/endocrine: Supple,no lymphadenopathy CV: RRR Chest: CTAB, no wheeze or crackles. Good air movement, normal resp effort.  Skin: no rashes, purpura or petechiae.  Neuro: Normal gait. PERLA. EOMi. Alert. Oriented x3 Psych: Normal affect, dress and demeanor. Normal speech. Normal thought content and judgment.  No exam data present No results found. No results found for this or any previous visit (from the past 24 hour(s)).  Assessment/Plan: Troy Taylor is a 53 y.o. male present for OV for  There are no diagnoses linked to this encounter. Acute non-recurrent maxillary sinusitis Rest, hydrate.  Start  mucinex DM if cough, nettie pot or nasal saline.  Flonase nasal spray, zyrtec and doxycyline prescribed, take until completed.  tessalon Perles for cough.  If cough present it can last up to 6-8 weeks.  Work excuse for 2 days.  F/U 2 weeks of not improved.    Reviewed expectations re: course of current medical issues.  Discussed self-management of symptoms.  Outlined signs and symptoms indicating need for more acute intervention.  Patient verbalized understanding and all questions were answered.  Patient received an After-Visit Summary.    No orders of the defined types were placed in this encounter.    Note is dictated utilizing voice recognition software. Although note has been proof read prior to signing, occasional typographical errors still can be missed. If any questions arise, please do not hesitate to call for verification.   electronically signed by:  Howard Pouch, DO  [place]

## 2018-06-07 NOTE — Patient Instructions (Signed)
Rest, hydrate.  Start  mucinex DM if cough, nettie pot or nasal saline.  Flonase nasal spray, zyrtec and doxycyline prescribed, take until completed.  If cough present it can last up to 6-8 weeks.  F/U 2 weeks of not improved.     Sinusitis, Adult Sinusitis is soreness and swelling (inflammation) of your sinuses. Sinuses are hollow spaces in the bones around your face. They are located:  Around your eyes.  In the middle of your forehead.  Behind your nose.  In your cheekbones. Your sinuses and nasal passages are lined with a fluid called mucus. Mucus drains out of your sinuses. Swelling can trap mucus in your sinuses. This lets germs (bacteria, virus, or fungus) grow, which leads to infection. Most of the time, this condition is caused by a virus. What are the causes? This condition is caused by:  Allergies.  Asthma.  Germs.  Things that block your nose or sinuses.  Growths in the nose (nasal polyps).  Chemicals or irritants in the air.  Fungus (rare). What increases the risk? You are more likely to develop this condition if:  You have a weak body defense system (immune system).  You do a lot of swimming or diving.  You use nasal sprays too much.  You smoke. What are the signs or symptoms? The main symptoms of this condition are pain and a feeling of pressure around the sinuses. Other symptoms include:  Stuffy nose (congestion).  Runny nose (drainage).  Swelling and warmth in the sinuses.  Headache.  Toothache.  A cough that may get worse at night.  Mucus that collects in the throat or the back of the nose (postnasal drip).  Being unable to smell and taste.  Being very tired (fatigue).  A fever.  Sore throat.  Bad breath. How is this diagnosed? This condition is diagnosed based on:  Your symptoms.  Your medical history.  A physical exam.  Tests to find out if your condition is short-term (acute) or long-term (chronic). Your doctor may:  ? Check your nose for growths (polyps). ? Check your sinuses using a tool that has a light (endoscope). ? Check for allergies or germs. ? Do imaging tests, such as an MRI or CT scan. How is this treated? Treatment for this condition depends on the cause and whether it is short-term or long-term.  If caused by a virus, your symptoms should go away on their own within 10 days. You may be given medicines to relieve symptoms. They include: ? Medicines that shrink swollen tissue in the nose. ? Medicines that treat allergies (antihistamines). ? A spray that treats swelling of the nostrils. ? Rinses that help get rid of thick mucus in your nose (nasal saline washes).  If caused by bacteria, your doctor may wait to see if you will get better without treatment. You may be given antibiotic medicine if you have: ? A very bad infection. ? A weak body defense system.  If caused by growths in the nose, you may need to have surgery. Follow these instructions at home: Medicines  Take, use, or apply over-the-counter and prescription medicines only as told by your doctor. These may include nasal sprays.  If you were prescribed an antibiotic medicine, take it as told by your doctor. Do not stop taking the antibiotic even if you start to feel better. Hydrate and humidify   Drink enough water to keep your pee (urine) pale yellow.  Use a cool mist humidifier to keep the humidity  level in your home above 50%.  Breathe in steam for 10-15 minutes, 3-4 times a day, or as told by your doctor. You can do this in the bathroom while a hot shower is running.  Try not to spend time in cool or dry air. Rest  Rest as much as you can.  Sleep with your head raised (elevated).  Make sure you get enough sleep each night. General instructions   Put a warm, moist washcloth on your face 3-4 times a day, or as often as told by your doctor. This will help with discomfort.  Wash your hands often with soap and  water. If there is no soap and water, use hand sanitizer.  Do not smoke. Avoid being around people who are smoking (secondhand smoke).  Keep all follow-up visits as told by your doctor. This is important. Contact a doctor if:  You have a fever.  Your symptoms get worse.  Your symptoms do not get better within 10 days. Get help right away if:  You have a very bad headache.  You cannot stop throwing up (vomiting).  You have very bad pain or swelling around your face or eyes.  You have trouble seeing.  You feel confused.  Your neck is stiff.  You have trouble breathing. Summary  Sinusitis is swelling of your sinuses. Sinuses are hollow spaces in the bones around your face.  This condition is caused by tissues in your nose that become inflamed or swollen. This traps germs. These can lead to infection.  If you were prescribed an antibiotic medicine, take it as told by your doctor. Do not stop taking it even if you start to feel better.  Keep all follow-up visits as told by your doctor. This is important. This information is not intended to replace advice given to you by your health care provider. Make sure you discuss any questions you have with your health care provider. Document Released: 08/25/2007 Document Revised: 08/08/2017 Document Reviewed: 08/08/2017 Elsevier Interactive Patient Education  2019 [employer].

## 2018-10-13 ENCOUNTER — Other Ambulatory Visit: Payer: Self-pay

## 2018-11-07 ENCOUNTER — Other Ambulatory Visit: Payer: 59

## 2019-09-20 DIAGNOSIS — U071 COVID-19: Secondary | ICD-10-CM

## 2019-09-20 HISTORY — DX: COVID-19: U07.1

## 2019-11-02 ENCOUNTER — Encounter: Payer: 59 | Admitting: Family Medicine

## 2020-02-11 ENCOUNTER — Ambulatory Visit (INDEPENDENT_AMBULATORY_CARE_PROVIDER_SITE_OTHER): Payer: 59 | Admitting: Family Medicine

## 2020-02-11 ENCOUNTER — Other Ambulatory Visit: Payer: Self-pay

## 2020-02-11 ENCOUNTER — Encounter: Payer: Self-pay | Admitting: Family Medicine

## 2020-02-11 VITALS — BP 120/70 | HR 60 | Temp 97.6°F | Resp 16 | Ht 68.75 in | Wt 169.6 lb

## 2020-02-11 DIAGNOSIS — E7800 Pure hypercholesterolemia, unspecified: Secondary | ICD-10-CM | POA: Diagnosis not present

## 2020-02-11 DIAGNOSIS — Z0000 Encounter for general adult medical examination without abnormal findings: Secondary | ICD-10-CM | POA: Diagnosis not present

## 2020-02-11 DIAGNOSIS — M62562 Muscle wasting and atrophy, not elsewhere classified, left lower leg: Secondary | ICD-10-CM

## 2020-02-11 DIAGNOSIS — Z125 Encounter for screening for malignant neoplasm of prostate: Secondary | ICD-10-CM

## 2020-02-11 DIAGNOSIS — M5417 Radiculopathy, lumbosacral region: Secondary | ICD-10-CM

## 2020-02-11 LAB — COMPREHENSIVE METABOLIC PANEL
ALT: 18 U/L (ref 0–53)
AST: 21 U/L (ref 0–37)
Albumin: 4.6 g/dL (ref 3.5–5.2)
Alkaline Phosphatase: 51 U/L (ref 39–117)
BUN: 20 mg/dL (ref 6–23)
CO2: 27 mEq/L (ref 19–32)
Calcium: 9.4 mg/dL (ref 8.4–10.5)
Chloride: 102 mEq/L (ref 96–112)
Creatinine, Ser: 0.94 mg/dL (ref 0.40–1.50)
GFR: 90.92 mL/min (ref >60.00)
Glucose, Bld: 81 mg/dL (ref 70–99)
Potassium: 3.9 mEq/L (ref 3.5–5.1)
Sodium: 139 mEq/L (ref 135–145)
Total Bilirubin: 1 mg/dL (ref 0.2–1.2)
Total Protein: 6.9 g/dL (ref 6.0–8.3)

## 2020-02-11 LAB — CBC WITH DIFFERENTIAL/PLATELET
Basophils Absolute: 0 10*3/uL (ref 0.0–0.1)
Basophils Relative: 0.6 % (ref 0.0–3.0)
Eosinophils Absolute: 0.2 10*3/uL (ref 0.0–0.7)
Eosinophils Relative: 2.4 % (ref 0.0–5.0)
HCT: 40.3 % (ref 39.0–52.0)
Hemoglobin: 13.5 g/dL (ref 13.0–17.0)
Lymphocytes Relative: 27.4 % (ref 12.0–46.0)
Lymphs Abs: 1.7 10*3/uL (ref 0.7–4.0)
MCHC: 33.4 g/dL (ref 30.0–36.0)
MCV: 84.3 fl (ref 78.0–100.0)
Monocytes Absolute: 0.5 10*3/uL (ref 0.1–1.0)
Monocytes Relative: 8.4 % (ref 3.0–12.0)
Neutro Abs: 3.8 10*3/uL (ref 1.4–7.7)
Neutrophils Relative %: 61.2 % (ref 43.0–77.0)
Platelets: 184 10*3/uL (ref 150.0–400.0)
RBC: 4.78 Mil/uL (ref 4.22–5.81)
RDW: 14.2 % (ref 11.5–15.5)
WBC: 6.2 10*3/uL (ref 4.0–10.5)

## 2020-02-11 LAB — PSA: PSA: 1.68 ng/mL (ref 0.10–4.00)

## 2020-02-11 LAB — LIPID PANEL
Cholesterol: 208 mg/dL — ABNORMAL HIGH (ref 0–200)
HDL: 58.9 mg/dL (ref >39.00)
LDL Cholesterol: 136 mg/dL — ABNORMAL HIGH (ref 0–99)
NonHDL: 149
Total CHOL/HDL Ratio: 4
Triglycerides: 65 mg/dL (ref 0.0–149.0)
VLDL: 13 mg/dL (ref 0.0–40.0)

## 2020-02-11 LAB — TSH: TSH: 1.12 u[IU]/mL (ref 0.35–4.50)

## 2020-02-11 NOTE — Patient Instructions (Signed)

## 2020-02-11 NOTE — Progress Notes (Signed)
Office Note 02/11/2020  CC:  Chief Complaint  Patient presents with  . Annual Exam    pt is fasting   HPI:  Troy Taylor is a 54 y.o. White male who is here for annual health maintenance exam.  Feeling pretty well. Hx mild intermittent asthma, mostly exercise induced. Had covid 09/2019, feels like he recovered fine. Says flu vaccine caused 10d flu-like illness last year so he doesn't want this. Also got hives and local inj rxn to flu vaccine last season.  Healthy diet. Exercise: stationary biking regularly.  Still having bothersome trouble with L calf weakness, chronic, hx of L calf atrophy. W/u has revealed evidence of lumbosacral radiculopathy but no anatomic lesion to correlate. He is interested in 2nd opinion.  Past Medical History:  Diagnosis Date  . Allergy    pollen, shellfish  . Asthma   . COVID 09/2019  . DDD (degenerative disc disease), lumbar    Mild DJD of L spine on x-ray 01/2017.  Marland Kitchen Eczema   . Hyperlipidemia   . Left calf atrophy 01/2017   with mild weakness of L calf.  No correlation with MRI L spine findings.  02/2018 NCS/EMG motor changes (radiculopathy) affecting the left L3-S1 nerve root/segments.  MR T spine unremarkable.  . Left lumbosacral radiculopathy    Chronic LBP with L leg paresthesias and weakness.  [facility] neuro 2019-->MRI L spine showed disc protrusion at the right lateral recess at L4-5 causing right L5 nerve root compression.  Ref to Dr. Posey Pronto,  NCS/EMGs + MRI T spine.  NCS/EMG 03/13/18->active chronic intraspinal canal lesion (i.e. radiculopathy)affecting the left L3-S1 nerve root/segments.  T spine MRI unremarkable.  . Seasonal allergies     Past Surgical History:  Procedure Laterality Date  . COLONOSCOPY  2017   normal per pt (dr. Earlean Shawl)  . NCS/EMG  03/14/2018   The electrophysiologic findings are suggestive of an active chronic intraspinal canal lesion (i.e. radiculopathy) affecting the left L3-S1 nerve root/segments.  T spine  MR unremarkable.  . Wisdon teeth extraction       Family History  Problem Relation Age of Onset  . Diabetes Mother   . Hypertension Mother   . Arthritis Mother   . Renal cancer Father 50  . Lymphoma Father   . BRCA 1/2 Father        BRCA2 mutation positive  . Other Father        CHEK2 mutation positive  . Asthma Father   . Allergies Father   . Breast cancer Paternal Grandmother 60       d.60  . Uterine cancer Paternal Grandmother 25  . Cancer Paternal Grandfather        unknown type    Social History   Socioeconomic History  . Marital status: Married    Spouse name: Not on file  . Number of children: 2  . Years of education: 16  . Highest education level: Bachelor's degree (e.g., BA, AB, BS)  Occupational History  . Occupation: Banker  Tobacco Use  . Smoking status: Never Smoker  . Smokeless tobacco: Never Used  Vaping Use  . Vaping Use: Never used  Substance and Sexual Activity  . Alcohol use: No  . Drug use: No  . Sexual activity: Not on file  Other Topics Concern  . Not on file  Social History Narrative   Married, 2 sons.   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   Tob:  never.   Alc: rare   Drugs none.   Lives with wife in a 2 story home.     Social Determinants of Health   Financial Resource Strain:   . Difficulty of Paying Living Expenses: Not on file  Food Insecurity:   . Worried About Charity fundraiser in the Last Year: Not on file  . Ran Out of Food in the Last Year: Not on file  Transportation Needs:   . Lack of Transportation (Medical): Not on file  . Lack of Transportation (Non-Medical): Not on file  Physical Activity:   . Days of Exercise per Week: Not on file  . Minutes of Exercise per Session: Not on file  Stress:   . Feeling of Stress : Not on file  Social Connections:   . Frequency of Communication with Friends and Family: Not on file  . Frequency of Social  Gatherings with Friends and Family: Not on file  . Attends Religious Services: Not on file  . Active Member of Clubs or Organizations: Not on file  . Attends Archivist Meetings: Not on file  . Marital Status: Not on file  Intimate Partner Violence:   . Fear of Current or Ex-Partner: Not on file  . Emotionally Abused: Not on file  . Physically Abused: Not on file  . Sexually Abused: Not on file    Outpatient Medications Prior to Visit  Medication Sig Dispense Refill  . benzonatate (TESSALON) 200 MG capsule Take 1 capsule (200 mg total) by mouth 2 (two) times daily as needed for cough. (Patient not taking: Reported on 02/11/2020) 20 capsule 0  . cetirizine (ZYRTEC) 10 MG tablet Take 1 tablet (10 mg total) by mouth at bedtime. (Patient not taking: Reported on 02/11/2020) 30 tablet 0  . doxycycline (VIBRA-TABS) 100 MG tablet Take 1 tablet (100 mg total) by mouth 2 (two) times daily. (Patient not taking: Reported on 02/11/2020) 20 tablet 0  . fluticasone (FLONASE) 50 MCG/ACT nasal spray Place 2 sprays into both nostrils daily. (Patient not taking: Reported on 02/11/2020) 16 g 0   No facility-administered medications prior to visit.    Allergies  Allergen Reactions  . Penicillins     ROS Review of Systems  Constitutional: Negative for appetite change, chills, fatigue and fever.  HENT: Negative for congestion, dental problem, ear pain and sore throat.   Eyes: Negative for discharge, redness and visual disturbance.  Respiratory: Negative for cough, chest tightness, shortness of breath and wheezing.   Cardiovascular: Negative for chest pain, palpitations and leg swelling.  Gastrointestinal: Negative for abdominal pain, blood in stool, diarrhea, nausea and vomiting.  Genitourinary: Negative for difficulty urinating, dysuria, flank pain, frequency, hematuria and urgency.  Musculoskeletal: Negative for arthralgias, back pain, joint swelling, myalgias and neck stiffness.  Skin:  Negative for pallor and rash.  Neurological: Negative for dizziness, speech difficulty, weakness and headaches.  Hematological: Negative for adenopathy. Does not bruise/bleed easily.  Psychiatric/Behavioral: Negative for confusion and sleep disturbance. The patient is not nervous/anxious.     PE; Vitals with BMI 02/11/2020 06/07/2018 02/10/2018  Height 5' 8.75" $Remove'5\' 9"'AgRNDPe$  $RemoveB'5\' 9"'HBfaNSyI$   Weight 169 lbs 10 oz 172 lbs 172 lbs 6 oz  BMI 25.24 13.24 40.10  Systolic 272 536 644  Diastolic 70 65 70  Pulse 60 63 61   Gen: Alert, well appearing.  Patient is oriented to person, place, time, and situation. AFFECT: pleasant, lucid thought and speech. ENT: Ears: EACs clear, normal epithelium.  TMs with  good light reflex and landmarks bilaterally.  Eyes: no injection, icteris, swelling, or exudate.  EOMI, PERRLA. Nose: no drainage or turbinate edema/swelling.  No injection or focal lesion.  Mouth: lips without lesion/swelling.  Oral mucosa pink and moist.  Dentition intact and without obvious caries or gingival swelling.  Oropharynx without erythema, exudate, or swelling.  Neck: supple/nontender.  No LAD, mass, or TM.  Carotid pulses 2+ bilaterally, without bruits. CV: RRR, no m/r/g.   LUNGS: CTA bilat, nonlabored resps, good aeration in all lung fields. ABD: soft, NT, ND, BS normal.  No hepatospenomegaly or mass.  No bruits. EXT: no clubbing, cyanosis, or edema.  Musculoskeletal: no joint swelling, erythema, warmth, or tenderness.  ROM of all joints intact. L calf notably atrophic.  No tenderness of calf.  Minimal weakness of L ankle plantar flexors. Skin - no sores or suspicious lesions or rashes or color changes   Pertinent labs:  Lab Results  Component Value Date   TSH 2.15 02/10/2018   Lab Results  Component Value Date   WBC 11.8 (H) 07/25/2017   HGB 15.1 07/25/2017   HCT 45.7 07/25/2017   MCV 85.9 07/25/2017   PLT 207.0 07/25/2017   Lab Results  Component Value Date   CREATININE 1.06 07/25/2017    BUN 14 07/25/2017   NA 138 07/25/2017   K 4.5 07/25/2017   CL 101 07/25/2017   CO2 30 07/25/2017   Lab Results  Component Value Date   ALT 20 02/03/2017   AST 21 02/03/2017   ALKPHOS 55 02/03/2017   BILITOT 0.8 02/03/2017   Lab Results  Component Value Date   CHOL 213 (H) 02/03/2017   Lab Results  Component Value Date   HDL 57.20 02/03/2017   Lab Results  Component Value Date   LDLCALC 140 (H) 02/03/2017   Lab Results  Component Value Date   TRIG 75.0 02/03/2017   Lab Results  Component Value Date   CHOLHDL 4 02/03/2017   Lab Results  Component Value Date   PSA 3.10 02/03/2017   PSA 1.03 04/18/2015   ASSESSMENT AND PLAN:   1) Health maintenance exam: Reviewed age and gender appropriate health maintenance issues (prudent diet, regular exercise, health risks of tobacco and excessive alcohol, use of seatbelts, fire alarms in home, use of sunscreen).  Also reviewed age and gender appropriate health screening as well as vaccine recommendations. Vaccines: Flu->pt declines.  Covid 19->declined.  He has had covid infection 09/2019.  Tdap UTD. Labs: fasting HP, PSA. Prostate ca screening: PSA. Colon ca screening: normal TCS 2017, recall 2027.  2) Left calf weakness, atrophy--left lumbosacral radiculopathy on neurologic w/u in the past. No anatomic lesion to correlate with this on MRI imaging. Pt has had to switch from running to biking for exercise due to the leg symptoms, also some compensatory-type pain in hips and back at times. He is interested in seeking another neurologic eval/opinion so I referred him to [facility] neurology today.  An After Visit Summary was printed and given to the patient.  FOLLOW UP:  Return in about 1 year (around 02/10/2021) for annual CPE (fasting).  Signed:  Crissie Sickles, MD           02/11/2020

## 2020-04-17 DIAGNOSIS — U071 COVID-19: Secondary | ICD-10-CM | POA: Diagnosis not present

## 2020-04-29 IMAGING — MR MR THORACIC SPINE W/O CM
4 of 6 series · 20 of 48 positions shown · non-contrast
Comparison: None.

CLINICAL DATA: Mid back pain status post fall 8 years ago. Left leg
weakness.

EXAM:
MRI THORACIC SPINE WITHOUT CONTRAST
TECHNIQUE: Multiplanar, multisequence MR imaging of the thoracic spine was
performed. No intravenous contrast was administered.

[Series 17: T1 · sagittal · 3.0mm · 0.91mm/px · 3 of 15 slices shown]
[im 1/15]
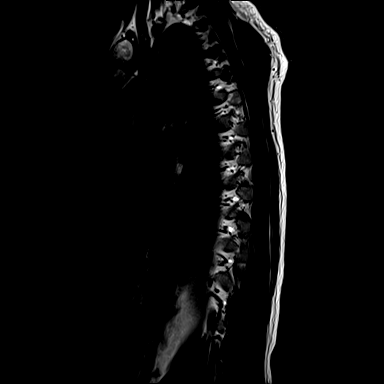
[im 8/15]
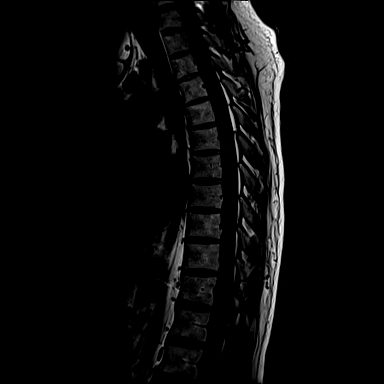
[im 15/15]
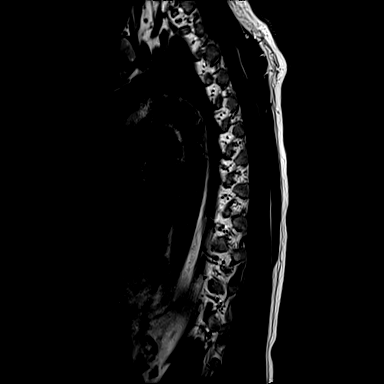

[Series 18: STIR · sagittal · 3.0mm · 1.00mm/px · 3 of 15 slices shown]
[im 1/15]
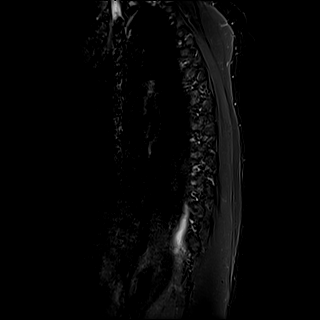
[im 8/15]
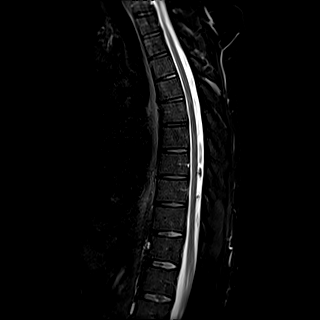
[im 15/15]
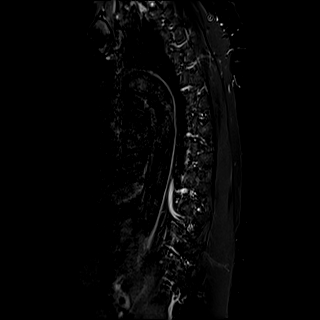

[Series 19: T2 · sagittal · 3.0mm · 0.83mm/px · 5 of 15 slices shown (1 of 2)]
[im 1/15]
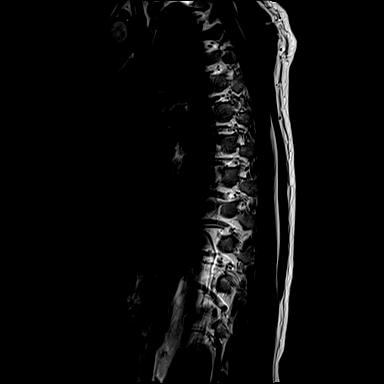
[im 4/15]
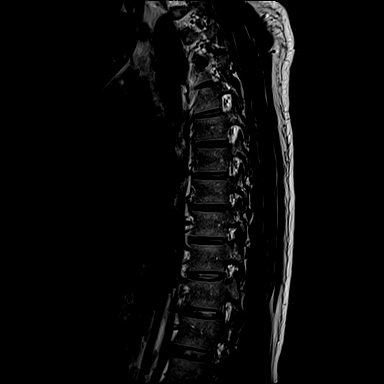
[im 8/15]
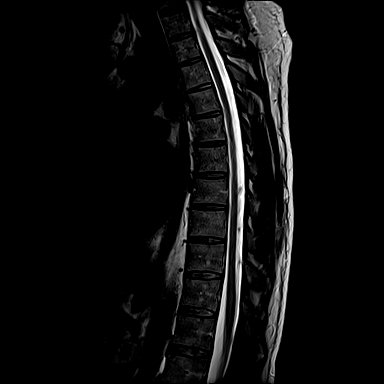
[im 11/15]
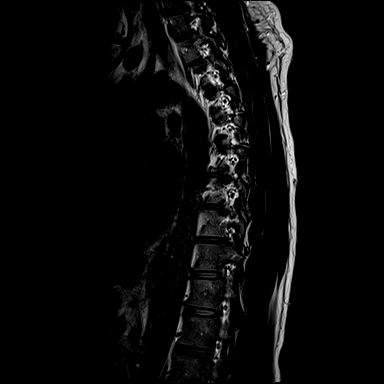
[im 15/15]
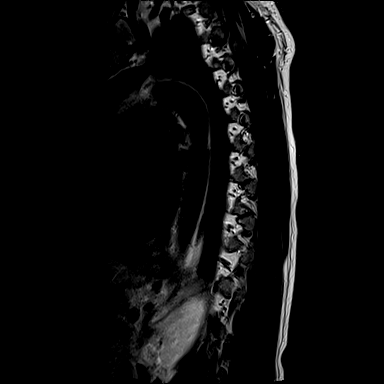

[Series 20: T2 · axial · 4.0mm · 0.28mm/px · z∈[-457,-152]mm · 9 of 48 slices shown (2 of 2)]
[im 1/48]
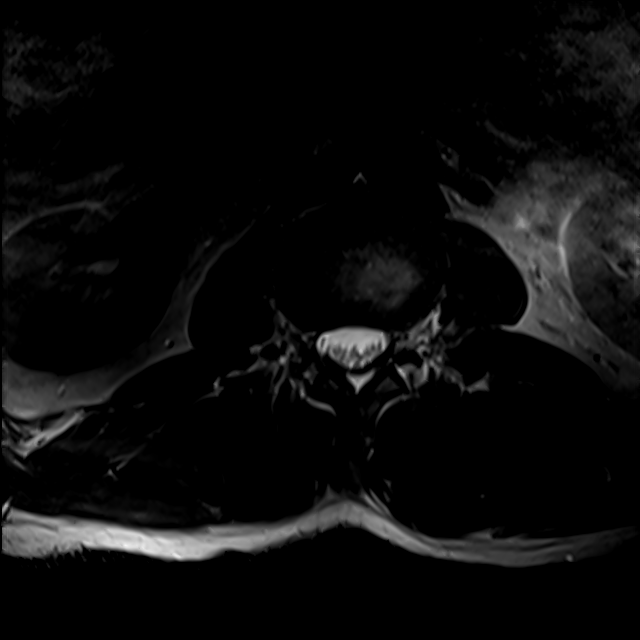
[im 7/48]
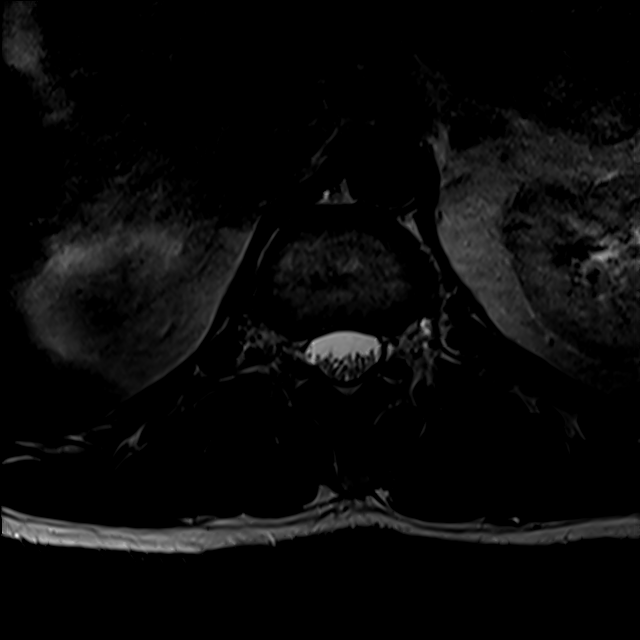
[im 14/48]
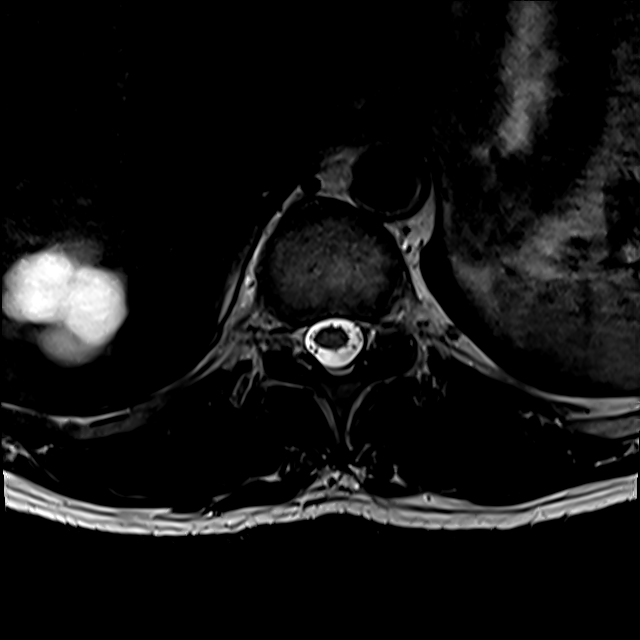
[im 21/48]
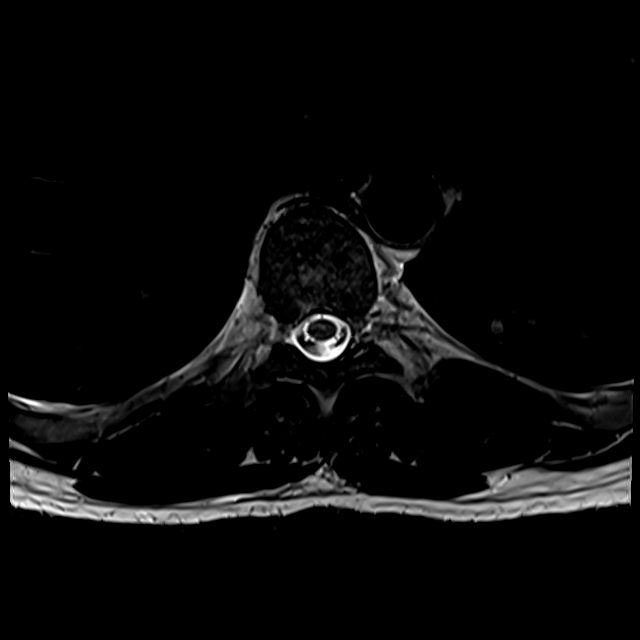
[im 24/48]
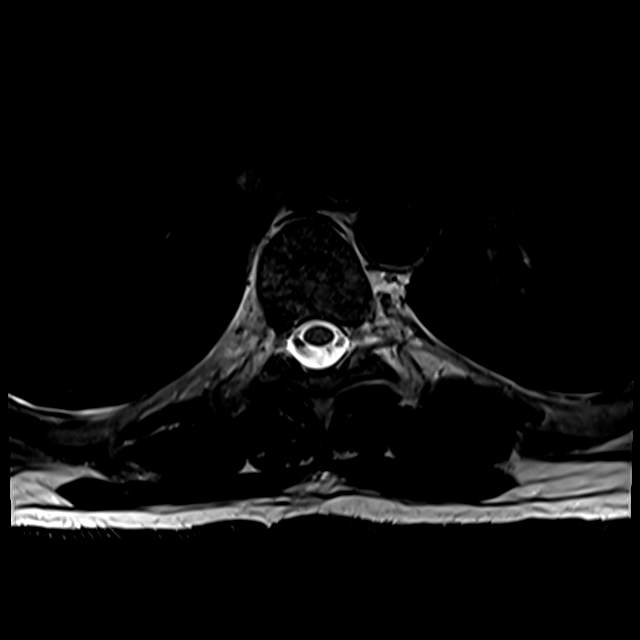
[im 27/48]
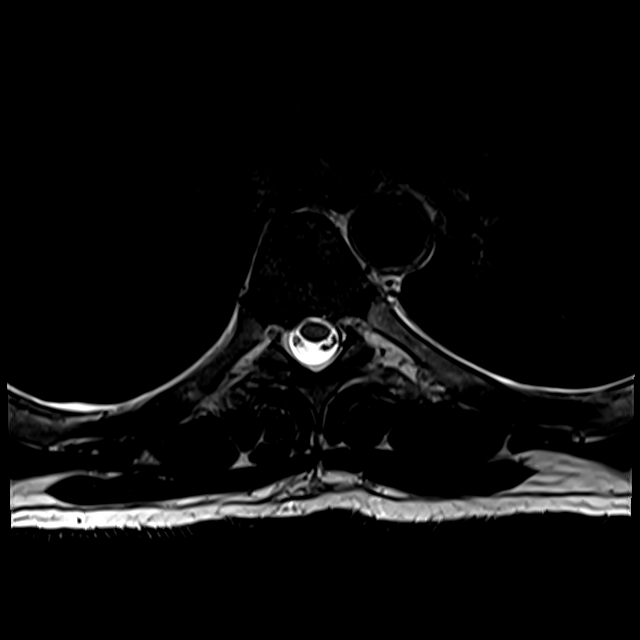
[im 34/48]
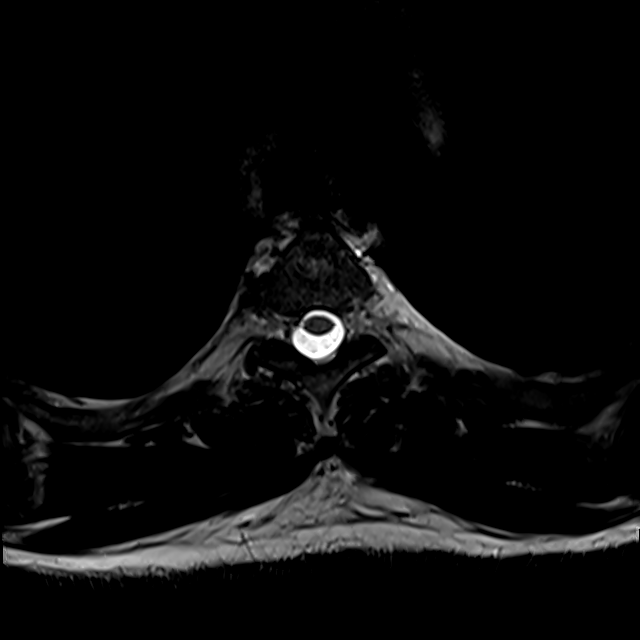
[im 41/48]
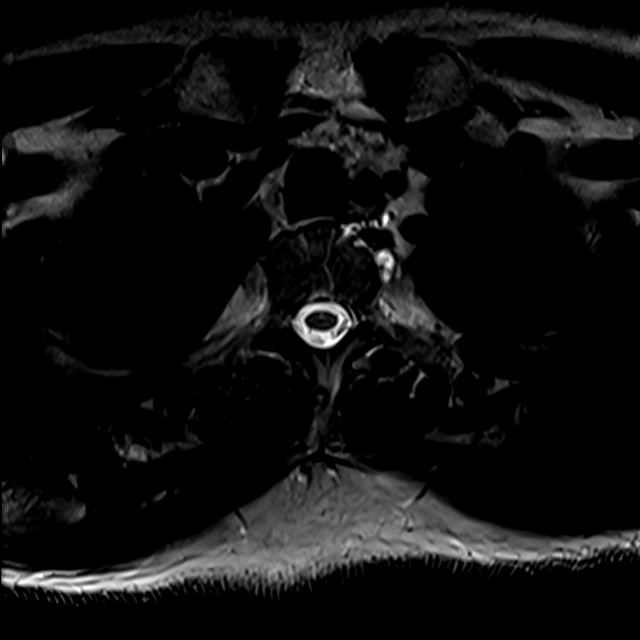
[im 48/48]
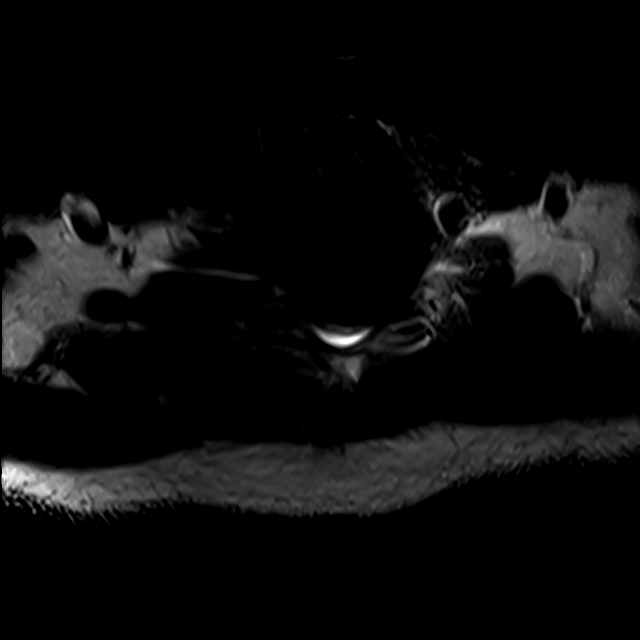

[20 of 48 positions shown; findings below may reference images not displayed]

FINDINGS: Alignment:  Physiologic.

Vertebrae: No fracture, evidence of discitis, or bone lesion.

Cord:  Normal signal and morphology.

Paraspinal and other soft tissues: No acute paraspinal abnormality.
Lobulated T2 hyperintense 3.5 x 3.1 cm right hepatic mass likely
reflecting a cyst which is incompletely characterized on this exam.

Disc levels:

Disc spaces:  Disc spaces are maintained.

T1-T2: No disc protrusion, foraminal stenosis or central canal
stenosis.

T2-T3: No disc protrusion, foraminal stenosis or central canal
stenosis.

T3-T4: No disc protrusion, foraminal stenosis or central canal
stenosis.

T4-T5: Small right paracentral disc protrusion. No foraminal or
central canal stenosis.

T5-T6: No disc protrusion, foraminal stenosis or central canal
stenosis.

T6-T7: No disc protrusion, foraminal stenosis or central canal
stenosis.

T7-T8: No disc protrusion, foraminal stenosis or central canal
stenosis.

T8-T9: No disc protrusion, foraminal stenosis or central canal
stenosis.

T9-T10: No disc protrusion, foraminal stenosis or central canal
stenosis.

T10-T11: No disc protrusion, foraminal stenosis or central canal
stenosis.

T11-T12: No disc protrusion, foraminal stenosis or central canal
stenosis.
IMPRESSION: 1.  No acute osseous injury of the thoracic spine.
2. Small right paracentral disc protrusion at T4-5 without foraminal
or central canal stenosis.

## 2020-12-19 DIAGNOSIS — M62562 Muscle wasting and atrophy, not elsewhere classified, left lower leg: Secondary | ICD-10-CM | POA: Diagnosis not present

## 2020-12-24 DIAGNOSIS — H6692 Otitis media, unspecified, left ear: Secondary | ICD-10-CM | POA: Diagnosis not present

## 2020-12-24 DIAGNOSIS — H9201 Otalgia, right ear: Secondary | ICD-10-CM | POA: Diagnosis not present

## 2021-01-02 DIAGNOSIS — M48061 Spinal stenosis, lumbar region without neurogenic claudication: Secondary | ICD-10-CM | POA: Diagnosis not present

## 2021-01-02 DIAGNOSIS — M4716 Other spondylosis with myelopathy, lumbar region: Secondary | ICD-10-CM | POA: Diagnosis not present

## 2021-01-05 ENCOUNTER — Telehealth: Payer: Self-pay

## 2021-01-05 ENCOUNTER — Encounter: Payer: Self-pay | Admitting: Family Medicine

## 2021-01-05 ENCOUNTER — Other Ambulatory Visit: Payer: Self-pay

## 2021-01-05 ENCOUNTER — Telehealth (INDEPENDENT_AMBULATORY_CARE_PROVIDER_SITE_OTHER): Payer: BC Managed Care – PPO | Admitting: Family Medicine

## 2021-01-05 VITALS — BP 142/88 | Wt 170.0 lb

## 2021-01-05 DIAGNOSIS — R051 Acute cough: Secondary | ICD-10-CM | POA: Diagnosis not present

## 2021-01-05 DIAGNOSIS — R0789 Other chest pain: Secondary | ICD-10-CM | POA: Diagnosis not present

## 2021-01-05 DIAGNOSIS — J069 Acute upper respiratory infection, unspecified: Secondary | ICD-10-CM

## 2021-01-05 MED ORDER — PREDNISONE 20 MG PO TABS
ORAL_TABLET | ORAL | 0 refills | Status: DC
Start: 2021-01-05 — End: 2021-03-06

## 2021-01-05 MED ORDER — ALBUTEROL SULFATE HFA 108 (90 BASE) MCG/ACT IN AERS: 2.0000 | INHALATION_SPRAY | Freq: Four times a day (QID) | RESPIRATORY_TRACT | 0 refills | Status: DC | PRN

## 2021-01-05 NOTE — Telephone Encounter (Signed)
[  4:28 PM] McGowen, Phillip Pls call Christapher Gillian and make nonfasting lab appt for his earliest convenience, I'll put orders in.  Also give him the address for med center [facility] for CXR.

## 2021-01-05 NOTE — Progress Notes (Signed)
Virtual Visit via Video Note  I connected with Troy Taylor on 01/05/21 at  4:00 PM EDT by a video enabled telemedicine application and verified that I am speaking with the correct person using two identifiers.  Location patient: home, [place] Location provider:work or home office Persons participating in the virtual visit: patient, provider  I discussed the limitations of evaluation and management by telemedicine and the availability of in person appointments. The patient expressed understanding and agreed to proceed.  Telemedicine visit is a necessity given the COVID-19 restrictions in place at the current time.  HPI: 55 y/o male being seen today for respiratory symptoms. Onset about 8 to 10 days ago with headache and right ear ache and ache across the top of right shoulder.  Was also having some shortness of breath and is uncomfortable ache in upper sternal area.  No fever and no cough at that time and no URI symptoms at that time.  He went to urgent care and was treated for ear infection with Omnicef x10 days and a prednisone dose pack.  About 3 days after getting on these meds he noted resolution of his ear pain and the shortness of breath gone away as well.  Now, over the last 3 days or so he has had resurgence of the pain in the upper sternal area and excessive nasal congestion, postnasal drip, and a dry cough.  Feeling quite tired still.  No shortness of breath, no wheezing, no fever. No diaphoresis, no nausea, no palpitations, no dizziness, no jaw or arm pain. He has not done any home COVID testing.  Test for COVID was not done at the urgent care visit.  ROS: See pertinent positives and negatives per HPI.  Past Medical History:  Diagnosis Date   Allergy    pollen, shellfish   Asthma    COVID 09/2019   DDD (degenerative disc disease), lumbar    Mild DJD of L spine on x-ray 01/2017.   Eczema    Hyperlipidemia    Left calf atrophy 01/2017   with mild weakness of L calf.  No correlation with  MRI L spine findings.  02/2018 NCS/EMG motor changes (radiculopathy) affecting the left L3-S1 nerve root/segments.  MR T spine unremarkable.   Left lumbosacral radiculopathy    Chronic LBP with L leg paresthesias and weakness.  [facility] neuro 2019-->MRI L spine showed disc protrusion at the right lateral recess at L4-5 causing right L5 nerve root compression.  Ref to Dr. Posey Pronto,  NCS/EMGs + MRI T spine.  NCS/EMG 03/13/18->active chronic intraspinal canal lesion (i.e. radiculopathy)affecting the left L3-S1 nerve root/segments.  T spine MRI unremarkable.   Seasonal allergies     Past Surgical History:  Procedure Laterality Date   COLONOSCOPY  2017   normal per pt (dr. Earlean Shawl)   NCS/EMG  03/14/2018   The electrophysiologic findings are suggestive of an active chronic intraspinal canal lesion (i.e. radiculopathy) affecting the left L3-S1 nerve root/segments.  T spine MR unremarkable.   Wisdon teeth extraction       No current outpatient medications on file.  EXAM:  VITALS per patient if applicable:  Vitals with BMI 01/05/2021 02/11/2020 06/07/2018  Height - 5' 8.75" $Remove'5\' 9"'PIwcEWh$   Weight 170 lbs 169 lbs 10 oz 172 lbs  BMI - 40.08 67.61  Systolic 950 932 671  Diastolic 88 70 65  Pulse - 60 63     GENERAL: alert, oriented, appears well and in no acute distress He sounds a bit congested and coughs some.  His voice is a little gravelly.  HEENT: atraumatic, conjunttiva clear, no obvious abnormalities on inspection of external nose and ears  NECK: normal movements of the head and neck  LUNGS: on inspection no signs of respiratory distress, breathing rate appears normal, no obvious gross SOB, gasping or wheezing  CV: no obvious cyanosis  MS: moves all visible extremities without noticeable abnormality  PSYCH/NEURO: pleasant and cooperative, no obvious depression or anxiety, speech and thought processing grossly intact  LABS: none today    Chemistry      Component Value Date/Time   NA 139  02/11/2020 1432   K 3.9 02/11/2020 1432   CL 102 02/11/2020 1432   CO2 27 02/11/2020 1432   BUN 20 02/11/2020 1432   CREATININE 0.94 02/11/2020 1432      Component Value Date/Time   CALCIUM 9.4 02/11/2020 1432   ALKPHOS 51 02/11/2020 1432   AST 21 02/11/2020 1432   ALT 18 02/11/2020 1432   BILITOT 1.0 02/11/2020 1432     ASSESSMENT AND PLAN:  Discussed the following assessment and plan:  #1 acute respiratory illness. Initially with significant ear pain and shortness of breath but this resolved with treatment with Omnicef and prednisone.  Now the illness seems more consistent with URI with acute bronchitis, question of reactive airway component.   Upper sternal chest pain is somewhat concerning although this certainly could be a symptom of acute bronchitis. Will treat with prednisone 40 mg a day for 5 days, then 20 mg a day for 5 days.  We will also do albuterol inhaler every 6 hours to see if this helps.  Recommended over-the-counter saline nasal spray and Delsym. I will have him come in for CBC, be met, and D-dimer.  We will have him get a chest x-ray as well.    I discussed the assessment and treatment plan with the patient. The patient was provided an opportunity to ask questions and all were answered. The patient agreed with the plan and demonstrated an understanding of the instructions.   F/u: if not signif imp in 3-4d  Signed:  Crissie Sickles, MD           01/05/2021

## 2021-01-05 NOTE — Telephone Encounter (Signed)
Spoke with pt, lab appt scheduled. Address given for imaging location

## 2021-01-08 ENCOUNTER — Other Ambulatory Visit: Payer: Self-pay

## 2021-01-08 ENCOUNTER — Ambulatory Visit (INDEPENDENT_AMBULATORY_CARE_PROVIDER_SITE_OTHER): Payer: BC Managed Care – PPO

## 2021-01-08 DIAGNOSIS — R0789 Other chest pain: Secondary | ICD-10-CM | POA: Diagnosis not present

## 2021-01-08 DIAGNOSIS — R051 Acute cough: Secondary | ICD-10-CM

## 2021-01-08 LAB — CBC WITH DIFFERENTIAL/PLATELET
Basophils Absolute: 0.1 10*3/uL (ref 0.0–0.1)
Basophils Relative: 1 % (ref 0.0–3.0)
Eosinophils Absolute: 0.2 10*3/uL (ref 0.0–0.7)
Eosinophils Relative: 2.5 % (ref 0.0–5.0)
HCT: 43 % (ref 39.0–52.0)
Hemoglobin: 13.9 g/dL (ref 13.0–17.0)
Lymphocytes Relative: 17.3 % (ref 12.0–46.0)
Lymphs Abs: 1.4 10*3/uL (ref 0.7–4.0)
MCHC: 32.2 g/dL (ref 30.0–36.0)
MCV: 85.9 fl (ref 78.0–100.0)
Monocytes Absolute: 0.9 10*3/uL (ref 0.1–1.0)
Monocytes Relative: 10.7 % (ref 3.0–12.0)
Neutro Abs: 5.5 10*3/uL (ref 1.4–7.7)
Neutrophils Relative %: 68.5 % (ref 43.0–77.0)
Platelets: 205 10*3/uL (ref 150.0–400.0)
RBC: 5.01 Mil/uL (ref 4.22–5.81)
RDW: 14.4 % (ref 11.5–15.5)
WBC: 8 10*3/uL (ref 4.0–10.5)

## 2021-01-08 LAB — BASIC METABOLIC PANEL
BUN: 15 mg/dL (ref 6–23)
CO2: 30 mEq/L (ref 19–32)
Calcium: 9.6 mg/dL (ref 8.4–10.5)
Chloride: 103 mEq/L (ref 96–112)
Creatinine, Ser: 1.13 mg/dL (ref 0.40–1.50)
GFR: 72.43 mL/min (ref >60.00)
Glucose, Bld: 102 mg/dL — ABNORMAL HIGH (ref 70–99)
Potassium: 3.9 mEq/L (ref 3.5–5.1)
Sodium: 140 mEq/L (ref 135–145)

## 2021-01-09 LAB — D-DIMER, QUANTITATIVE: D-Dimer, Quant: 0.47 mcg/mL FEU (ref <0.50)

## 2021-01-19 DIAGNOSIS — G5782 Other specified mononeuropathies of left lower limb: Secondary | ICD-10-CM | POA: Diagnosis not present

## 2021-01-19 DIAGNOSIS — M62562 Muscle wasting and atrophy, not elsewhere classified, left lower leg: Secondary | ICD-10-CM | POA: Diagnosis not present

## 2021-02-23 DIAGNOSIS — L821 Other seborrheic keratosis: Secondary | ICD-10-CM | POA: Diagnosis not present

## 2021-02-23 DIAGNOSIS — D225 Melanocytic nevi of trunk: Secondary | ICD-10-CM | POA: Diagnosis not present

## 2021-03-06 ENCOUNTER — Other Ambulatory Visit: Payer: Self-pay

## 2021-03-06 ENCOUNTER — Encounter: Payer: Self-pay | Admitting: Family Medicine

## 2021-03-06 ENCOUNTER — Ambulatory Visit (INDEPENDENT_AMBULATORY_CARE_PROVIDER_SITE_OTHER): Payer: BC Managed Care – PPO | Admitting: Family Medicine

## 2021-03-06 VITALS — BP 113/73 | HR 63 | Temp 97.9°F | Ht 69.0 in | Wt 178.6 lb

## 2021-03-06 DIAGNOSIS — E7800 Pure hypercholesterolemia, unspecified: Secondary | ICD-10-CM

## 2021-03-06 DIAGNOSIS — Z0000 Encounter for general adult medical examination without abnormal findings: Secondary | ICD-10-CM

## 2021-03-06 DIAGNOSIS — N3281 Overactive bladder: Secondary | ICD-10-CM | POA: Diagnosis not present

## 2021-03-06 DIAGNOSIS — Z125 Encounter for screening for malignant neoplasm of prostate: Secondary | ICD-10-CM

## 2021-03-06 MED ORDER — SOLIFENACIN SUCCINATE 5 MG PO TABS: 5.0000 mg | ORAL_TABLET | Freq: Every day | ORAL | 1 refills | Status: DC

## 2021-03-06 NOTE — Patient Instructions (Signed)

## 2021-03-06 NOTE — Progress Notes (Signed)
Office Note 03/06/2021  CC:  Chief Complaint  Patient presents with   Annual Exam    Pt is not fasting    HPI:  Patient is a 55 y.o. male who is here for annual health maintenance exam. Which feels good, says he is nearly completely over his recent respiratory illness.  Just a little residual cough.  Reports gradually worsening urinary frequency and urgency.  No dysuria.  Gets up 1 time at night to pee.  Occasional suprapubic pressure when he holds his urine too long.  No incontinence but feels like he almost will lose urine sometimes.  Stream is fine.  Feels like he empties well.  Past Medical History:  Diagnosis Date   Allergy    pollen, shellfish   Asthma    COVID 09/2019   DDD (degenerative disc disease), lumbar    Mild DJD of L spine on x-ray 01/2017.   Eczema    Hyperlipidemia    Left calf atrophy 01/2017   with mild weakness of L calf.  No correlation with MRI L spine findings.  02/2018 NCS/EMG motor changes (radiculopathy) affecting the left L3-S1 nerve root/segments.  MR T spine unremarkable.   Left lumbosacral radiculopathy    Chronic LBP with L leg paresthesias and weakness.  [facility] 2019-->MRI L spine showed disc protrusion at the right lateral recess at L4-5 causing right L5 nerve root compression.  Ref to Dr. Posey Pronto,  NCS/EMGs + MRI T spine.  NCS/EMG 03/13/18->active chronic intraspinal canal lesion (i.e. radiculopathy)affecting the left L3-S1 nerve root/segments.  T spine MRI unremarkable.   Seasonal allergies     Past Surgical History:  Procedure Laterality Date   COLONOSCOPY  2017   normal per pt (dr. Earlean Shawl)   NCS/EMG  03/14/2018   The electrophysiologic findings are suggestive of an active chronic intraspinal canal lesion (i.e. radiculopathy) affecting the left L3-S1 nerve root/segments.  T spine MR unremarkable.   Wisdon teeth extraction       Family History  Problem Relation Age of Onset   Diabetes Mother    Hypertension Mother    Arthritis  Mother    Renal cancer Father 1   Lymphoma Father    BRCA 1/2 Father        BRCA2 mutation positive   Other Father        CHEK2 mutation positive   Asthma Father    Allergies Father    Breast cancer Paternal Grandmother 33       d.60   Uterine cancer Paternal Grandmother 29   Cancer Paternal Grandfather        unknown type    Social History   Socioeconomic History   Marital status: Married    Spouse name: Not on file   Number of children: 2   Years of education: 16   Highest education level: Bachelor's degree (e.g., BA, AB, BS)  Occupational History   Occupation: Banker  Tobacco Use   Smoking status: Never   Smokeless tobacco: Never  Vaping Use   Vaping Use: Never used  Substance and Sexual Activity   Alcohol use: No   Drug use: No   Sexual activity: Not on file  Other Topics Concern   Not on file  Social History Narrative   Married, 2 sons.   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   Tob: never.   Alc: rare   Drugs none.   Lives with wife in a  2 story home.     Social Determinants of Health   Financial Resource Strain: Not on file  Food Insecurity: Not on file  Transportation Needs: Not on file  Physical Activity: Not on file  Stress: Not on file  Social Connections: Not on file  Intimate Partner Violence: Not on file    Outpatient Medications Prior to Visit  Medication Sig Dispense Refill   albuterol (VENTOLIN HFA) 108 (90 Base) MCG/ACT inhaler Inhale 2 puffs into the lungs every 6 (six) hours as needed for wheezing or shortness of breath. (Patient not taking: Reported on 03/06/2021) 8 g 0   predniSONE (DELTASONE) 20 MG tablet 2 tabs po qd x 5d, then 1 tab po qd x 5d (Patient not taking: Reported on 03/06/2021) 15 tablet 0   No facility-administered medications prior to visit.    Allergies  Allergen Reactions   Influenza Vaccines Palpitations, Shortness Of Breath and Swelling     Other reaction(s): Dizziness (intolerance) PT STATES IS ALLERGIC TO ALL VACCINES    Penicillins     ROS Review of Systems  Constitutional:  Negative for appetite change, chills, fatigue and fever.  HENT:  Negative for congestion, dental problem, ear pain and sore throat.   Eyes:  Negative for discharge, redness and visual disturbance.  Respiratory:  Negative for cough, chest tightness, shortness of breath and wheezing.   Cardiovascular:  Negative for chest pain, palpitations and leg swelling.  Gastrointestinal:  Negative for abdominal pain, blood in stool, diarrhea, nausea and vomiting.  Genitourinary:  Positive for frequency. Negative for difficulty urinating, dysuria, flank pain, hematuria and urgency.  Musculoskeletal:  Negative for arthralgias, back pain, joint swelling, myalgias and neck stiffness.  Skin:  Negative for pallor and rash.  Neurological:  Negative for dizziness, speech difficulty, weakness and headaches.  Hematological:  Negative for adenopathy. Does not bruise/bleed easily.  Psychiatric/Behavioral:  Negative for confusion and sleep disturbance. The patient is not nervous/anxious.    PE; Vitals with BMI 03/06/2021 01/05/2021 02/11/2020  Height _0  - 5' 8.75"  Weight 178 lbs 10 oz 170 lbs 169 lbs 10 oz  BMI 88.89 - 16.94  Systolic 503 888 280  Diastolic 73 88 70  Pulse 63 - 60   Gen: Alert, well appearing.  Patient is oriented to person, place, time, and situation. AFFECT: pleasant, lucid thought and speech. ENT: Ears: EACs clear, normal epithelium.  TMs with good light reflex and landmarks bilaterally.  Eyes: no injection, icteris, swelling, or exudate.  EOMI, PERRLA. Nose: no drainage or turbinate edema/swelling.  No injection or focal lesion.  Mouth: lips without lesion/swelling.  Oral mucosa pink and moist.  Dentition intact and without obvious caries or gingival swelling.  Oropharynx without erythema, exudate, or swelling.  Neck: supple/nontender.  No LAD,  mass, or TM.  Carotid pulses 2+ bilaterally, without bruits. CV: RRR, no m/r/g.   LUNGS: CTA bilat, nonlabored resps, good aeration in all lung fields. ABD: soft, NT, ND, BS normal.  No hepatospenomegaly or mass.  No bruits. EXT: no clubbing, cyanosis, or edema.  Musculoskeletal: no joint swelling, erythema, warmth, or tenderness.  ROM of all joints intact. Skin - no sores or suspicious lesions or rashes or color changes  Pertinent labs:  Lab Results  Component Value Date   TSH 1.12 02/11/2020   Lab Results  Component Value Date   WBC 8.0 01/08/2021   HGB 13.9 01/08/2021   HCT 43.0 01/08/2021   MCV 85.9 01/08/2021   PLT 205.0 01/08/2021  Lab Results  Component Value Date   DDIMER 0.47 01/08/2021   Lab Results  Component Value Date   CREATININE 1.13 01/08/2021   BUN 15 01/08/2021   NA 140 01/08/2021   K 3.9 01/08/2021   CL 103 01/08/2021   CO2 30 01/08/2021   Lab Results  Component Value Date   ALT 18 02/11/2020   AST 21 02/11/2020   ALKPHOS 51 02/11/2020   BILITOT 1.0 02/11/2020   Lab Results  Component Value Date   CHOL 208 (H) 02/11/2020   Lab Results  Component Value Date   HDL 58.90 02/11/2020   Lab Results  Component Value Date   LDLCALC 136 (H) 02/11/2020   Lab Results  Component Value Date   TRIG 65.0 02/11/2020   Lab Results  Component Value Date   CHOLHDL 4 02/11/2020   Lab Results  Component Value Date   PSA 1.68 02/11/2020   PSA 3.10 02/03/2017   PSA 1.03 04/18/2015   ASSESSMENT AND PLAN:   1) overactive bladder.  We will do trial of Vesicare 5 mg a day.  Therapeutic expectations and side effect profile of medication discussed today.  Patient's questions answered.  2) Health maintenance exam: Reviewed age and gender appropriate health maintenance issues (prudent diet, regular exercise, health risks of tobacco and excessive alcohol, use of seatbelts, fire alarms in home, use of sunscreen).  Also reviewed age and gender appropriate  health screening as well as vaccine recommendations. Vaccines: Tdap UTD. Flu->declined. Labs: CBC and CMET normal about 2 mo ago.  FLP, TSH, and PSA ordered. Prostate ca screening: PSA ordered. Colon ca screening: normal TCS 2017, recall 2027.  An After Visit Summary was printed and given to the patient.  FOLLOW UP:  Return for 4-6 wks f/u [facility].  Signed:  Crissie Sickles, MD           03/06/2021

## 2021-03-07 LAB — LIPID PANEL
Cholesterol: 215 mg/dL — ABNORMAL HIGH (ref <200)
HDL: 52 mg/dL (ref >=40)
LDL Cholesterol (Calc): 144 mg/dL (calc) — ABNORMAL HIGH
Non-HDL Cholesterol (Calc): 163 mg/dL (calc) — ABNORMAL HIGH (ref <130)
Total CHOL/HDL Ratio: 4.1 (calc) (ref <5.0)
Triglycerides: 88 mg/dL (ref <150)

## 2021-03-07 LAB — PSA: PSA: 1.51 ng/mL (ref <=4.00)

## 2021-03-07 LAB — TSH: TSH: 1.76 mIU/L (ref 0.40–4.50)

## 2021-03-09 DIAGNOSIS — M62562 Muscle wasting and atrophy, not elsewhere classified, left lower leg: Secondary | ICD-10-CM | POA: Diagnosis not present

## 2021-03-20 ENCOUNTER — Encounter: Payer: Self-pay | Admitting: Family Medicine

## 2021-03-30 ENCOUNTER — Other Ambulatory Visit: Payer: Self-pay | Admitting: Family Medicine

## 2021-09-14 ENCOUNTER — Encounter: Payer: Self-pay | Admitting: Family Medicine

## 2021-09-14 ENCOUNTER — Ambulatory Visit (INDEPENDENT_AMBULATORY_CARE_PROVIDER_SITE_OTHER): Payer: 59 | Admitting: Family Medicine

## 2021-09-14 VITALS — BP 111/75 | HR 72 | Temp 98.0°F | Ht 69.0 in | Wt 175.0 lb

## 2021-09-14 DIAGNOSIS — B349 Viral infection, unspecified: Secondary | ICD-10-CM | POA: Diagnosis not present

## 2021-09-14 LAB — POC COVID19 BINAXNOW: SARS Coronavirus 2 Ag: NEGATIVE

## 2021-09-14 NOTE — Progress Notes (Signed)
OFFICE VISIT  09/14/2021  CC:  Chief Complaint  Patient presents with   Headache   Chest congestion    Pt also runny nose, upset stomach for 2 days    Patient is a 56 y.o. male with a history of allergic rhinitis and asthma who presents for respiratory concerns.  HPI: Troy Taylor had onset of some nasal congestion and runny nose with postnasal drip cough about 4 to 5 days ago. Feels a mild headache and is mildly fatigued but no fever body aches.  No wheezing or shortness of breath. Couple of days ago he started to get some mild upset stomach and occasional loose stool.  No nausea or vomiting. No known sick contacts.  Past Medical History:  Diagnosis Date   Allergy    pollen, shellfish   Asthma    COVID 09/2019   DDD (degenerative disc disease), lumbar    Mild DJD of L spine on x-ray 01/2017.   Eczema    Hyperlipidemia    Left calf atrophy 01/2017   with mild weakness of L calf.  No correlation with MRI L spine findings.  02/2018 NCS/EMG motor changes (radiculopathy) affecting the left L3-S1 nerve root/segments.  MR T spine unremarkable. Rpt eval/2nd opinion also unrevealing.   Left lumbosacral radiculopathy    Chronic LBP with L leg paresthesias and weakness.  [facility] 2019-->MRI L spine showed disc protrusion at the right lateral recess at L4-5 causing right L5 nerve root compression.  Ref to Dr. Allena Katz,  NCS/EMGs + MRI T spine.  NCS/EMG 03/13/18->active chronic intraspinal canal lesion (i.e. radiculopathy)affecting the left L3-S1 nerve root/segments.  T spine MRI unremarkable.   Seasonal allergies     Past Surgical History:  Procedure Laterality Date   COLONOSCOPY  05/20/2015   Normal. Recall 2027 (Dr. Kinnie Scales)   NCS/EMG  03/14/2018   The electrophysiologic findings are suggestive of an active chronic intraspinal canal lesion (i.e. radiculopathy) affecting the left L3-S1 nerve root/segments.  T spine MR unremarkable.   [place]isdon teeth extraction       Outpatient Medications  Prior to Visit  Medication Sig Dispense Refill   albuterol (VENTOLIN HFA) 108 (90 Base) MCG/ACT inhaler Inhale 2 puffs into the lungs every 6 (six) hours as needed for wheezing or shortness of breath. (Patient not taking: Reported on 03/06/2021) 8 g 0   solifenacin (VESICARE) 5 MG tablet Take 1 tablet (5 mg total) by mouth daily. (Patient not taking: Reported on 09/14/2021) 30 tablet 1   No facility-administered medications prior to visit.    Allergies  Allergen Reactions   Influenza Vaccines Palpitations, Shortness Of Breath and Swelling    Other reaction(s): Dizziness (intolerance) PT STATES IS ALLERGIC TO ALL VACCINES    Penicillins     ROS As per HPI  PE:    09/14/2021    9:53 AM 03/06/2021    2:38 PM 01/05/2021    3:53 PM  Vitals with BMI  Height 5\' 9"  5\' 9"    Weight 175 lbs 178 lbs 10 oz 170 lbs  BMI 25.83 26.36   Systolic 111 113 409  Diastolic 75 73 88  Pulse 72 63      Physical Exam  VS: noted--normal. Gen: alert, NAD, NONTOXIC APPEARING. HEENT: eyes without injection, drainage, or swelling.  Ears: EACs clear, TMs with normal light reflex and landmarks.  Nose: Clear rhinorrhea, with some dried, crusty exudate adherent to mildly injected mucosa.  No purulent d/c.  No paranasal sinus TTP.  No facial swelling.  Throat and mouth without focal lesion.  No pharyngial swelling, erythema, or exudate.   Neck: supple, no LAD.   LUNGS: CTA bilat, nonlabored resps.   CV: RRR, no m/r/g. EXT: no c/c/e SKIN: no rash   LABS:  Last metabolic panel Lab Results  Component Value Date   GLUCOSE 102 (H) 01/08/2021   NA 140 01/08/2021   K 3.9 01/08/2021   CL 103 01/08/2021   CO2 30 01/08/2021   BUN 15 01/08/2021   CREATININE 1.13 01/08/2021   GFRNONAA >60 11/11/2016   CALCIUM 9.6 01/08/2021   PROT 6.9 02/11/2020   ALBUMIN 4.6 02/11/2020   BILITOT 1.0 02/11/2020   ALKPHOS 51 02/11/2020   AST 21 02/11/2020   ALT 18 02/11/2020   ANIONGAP 7 11/11/2016   Rapid covid  testing here today: NEG  IMPRESSION AND PLAN:  Viral URI/viral syndrome. COVID-negative. Recommended continue symptomatic care.  Call in 2 days if not improving significantly.  We discussed trial of antibiotics today, which we will do if he has not significantly improved 2 days.  An After Visit Summary was printed and given to the patient.  FOLLOW UP: Return if symptoms worsen or fail to improve.  Signed:  Santiago Bumpers, MD           09/14/2021

## 2022-09-27 ENCOUNTER — Other Ambulatory Visit: Payer: Self-pay | Admitting: Family Medicine

## 2022-09-27 ENCOUNTER — Ambulatory Visit (INDEPENDENT_AMBULATORY_CARE_PROVIDER_SITE_OTHER): Payer: 59 | Admitting: Family Medicine

## 2022-09-27 ENCOUNTER — Encounter: Payer: Self-pay | Admitting: Family Medicine

## 2022-09-27 VITALS — BP 102/64 | HR 62 | Temp 98.2°F | Ht 69.0 in | Wt 176.1 lb

## 2022-09-27 DIAGNOSIS — L255 Unspecified contact dermatitis due to plants, except food: Secondary | ICD-10-CM

## 2022-09-27 MED ORDER — DOXYCYCLINE HYCLATE 100 MG PO TABS: 100.0000 mg | ORAL_TABLET | Freq: Two times a day (BID) | ORAL | 0 refills | Status: AC

## 2022-09-27 MED ORDER — PREDNISONE 10 MG PO TABS: ORAL_TABLET | ORAL | 0 refills | Status: AC

## 2022-09-27 NOTE — Progress Notes (Signed)
Acute Office Visit   Subjective:  Patient ID: Troy Taylor, male    DOB: Nov 26, 1965, 57 y.o.   MRN: [id number]  Chief Complaint  Patient presents with   Poison Ivy    Pt is here with C/O of spots and pus coming out on Rt arm Sx started 09/22/2022 OTC - cream 9(not sure of the names and peroxide     HPI Patient is a 57 year old male that presents with rash on his right posterior forearm. Patient reports he noticed it Wednesday afternoon. He had been working in the yard on Monday and Tuesday previously. Itching.   He reports he has been using Peroxide and OTC anti-itching cream with no relief.   ROS See HPI above      Objective:   BP 102/64   Pulse 62   Temp 98.2 F (36.8 C)   Ht 5\' 9"  (1.753 m)   Wt 176 lb 2 oz (79.9 kg)   SpO2 95%   BMI 26.01 kg/m    Physical Exam Vitals reviewed.  Constitutional:      General: He is not in acute distress.    Appearance: Normal appearance. He is not ill-appearing, toxic-appearing or diaphoretic.  HENT:     Head: Normocephalic and atraumatic.  Eyes:     General:        Right eye: No discharge.        Left eye: No discharge.     Conjunctiva/sclera: Conjunctivae normal.  Cardiovascular:     Rate and Rhythm: Normal rate and regular rhythm.     Heart sounds: Normal heart sounds. No murmur heard.    No friction rub. No gallop.  Pulmonary:     Effort: Pulmonary effort is normal. No respiratory distress.     Breath sounds: Normal breath sounds.  Musculoskeletal:        General: Normal range of motion.  Skin:    General: Skin is warm and dry.     Findings: Rash (Right forearm-see images included) present.  Neurological:     General: No focal deficit present.     Mental Status: He is alert and oriented to person, place, and time. Mental status is at baseline.  Psychiatric:        Mood and Affect: Mood normal.        Behavior: Behavior normal.        Thought Content: Thought content normal.        Judgment: Judgment normal.             Assessment & Plan:  Dermatitis due to plants, including poison ivy, sumac, and oak -     Doxycycline Hyclate; Take 1 tablet (100 mg total) by mouth 2 (two) times daily for 7 days.  Dispense: 7 tablet; Refill: 0 -     predniSONE; Take 6 tablets (60 mg total) by mouth daily with breakfast for 1 day, THEN 5 tablets (50 mg total) daily with breakfast for 1 day, THEN 4 tablets (40 mg total) daily with breakfast for 1 day, THEN 3 tablets (30 mg total) daily with breakfast for 1 day, THEN 2 tablets (20 mg total) daily with breakfast for 1 day, THEN 1 tablet (10 mg total) daily with breakfast for 1 day.  Dispense: 21 tablet; Refill: 0  -Prescribed Doxycycline 100mg  tablet, take 1 tablet twice day for infected rash. -Prescribed Prednisone 10mg , 6 day taper. Do not take any NSAIDS, such as Ibuprofen, Aleve, Advil, or Naproxen while talking Prednisone. -Provided  hand out information about poison oak and ivy.  -Recommend to keep the area clean, pat dry, and apply over the counter calamine lotion. Keep rash covered with clean bandage. -Follow up if not improved or becomes worse.   Zandra Abts, NP

## 2022-09-27 NOTE — Patient Instructions (Addendum)
-  Prescribed Doxycycline 100mg  tablet, take 1 tablet twice day for infected rash. -Prescribed Prednisone 10mg , 6 day taper. Do not take any NSAIDS, such as Ibuprofen, Aleve, Advil, or Naproxen while talking Prednisone. -Provided hand out information about poison oak and ivy.  -Recommend to keep the area clean, pat dry, and apply over the counter calamine lotion. Keep rash covered with clean bandage. -Follow up if not improved or becomes worse.

## 2023-02-28 ENCOUNTER — Encounter: Payer: Self-pay | Admitting: Family Medicine

## 2023-02-28 ENCOUNTER — Ambulatory Visit: Payer: 59 | Admitting: Family Medicine

## 2023-02-28 VITALS — BP 124/76 | HR 60 | Ht 69.5 in | Wt 172.2 lb

## 2023-02-28 DIAGNOSIS — Z0000 Encounter for general adult medical examination without abnormal findings: Secondary | ICD-10-CM

## 2023-02-28 DIAGNOSIS — Z125 Encounter for screening for malignant neoplasm of prostate: Secondary | ICD-10-CM | POA: Diagnosis not present

## 2023-02-28 LAB — PSA: PSA: 2.65 ng/mL (ref 0.10–4.00)

## 2023-02-28 LAB — CBC
HCT: 43.6 % (ref 39.0–52.0)
Hemoglobin: 14.1 g/dL (ref 13.0–17.0)
MCHC: 32.3 g/dL (ref 30.0–36.0)
MCV: 88 fL (ref 78.0–100.0)
Platelets: 206 10*3/uL (ref 150.0–400.0)
RBC: 4.96 Mil/uL (ref 4.22–5.81)
RDW: 14.4 % (ref 11.5–15.5)
WBC: 5.3 10*3/uL (ref 4.0–10.5)

## 2023-02-28 LAB — COMPREHENSIVE METABOLIC PANEL
ALT: 24 U/L (ref 0–53)
AST: 28 U/L (ref 0–37)
Albumin: 4.8 g/dL (ref 3.5–5.2)
Alkaline Phosphatase: 63 U/L (ref 39–117)
BUN: 17 mg/dL (ref 6–23)
CO2: 30 meq/L (ref 19–32)
Calcium: 9.6 mg/dL (ref 8.4–10.5)
Chloride: 103 meq/L (ref 96–112)
Creatinine, Ser: 0.99 mg/dL (ref 0.40–1.50)
GFR: 83.63 mL/min (ref >60.00)
Glucose, Bld: 89 mg/dL (ref 70–99)
Potassium: 4.3 meq/L (ref 3.5–5.1)
Sodium: 140 meq/L (ref 135–145)
Total Bilirubin: 0.8 mg/dL (ref 0.2–1.2)
Total Protein: 7.1 g/dL (ref 6.0–8.3)

## 2023-02-28 LAB — LIPID PANEL
Cholesterol: 241 mg/dL — ABNORMAL HIGH (ref 0–200)
HDL: 55.3 mg/dL (ref >39.00)
LDL Cholesterol: 173 mg/dL — ABNORMAL HIGH (ref 0–99)
NonHDL: 186.12
Total CHOL/HDL Ratio: 4
Triglycerides: 64 mg/dL (ref 0.0–149.0)
VLDL: 12.8 mg/dL (ref 0.0–40.0)

## 2023-02-28 LAB — TSH: TSH: 1.78 u[IU]/mL (ref 0.35–5.50)

## 2023-02-28 NOTE — Progress Notes (Signed)
Office Note 02/28/2023  CC:  Chief Complaint  Patient presents with   Annual Exam    HPI:  Patient is a 57 y.o. male who is here for annual health maintenance exam. His last CPE was 03/06/2021.  He feels well. His son is an exercise science specialist and he has him on a very healthy lifestyle regimen.  Past Medical History:  Diagnosis Date   Allergy    pollen, shellfish   Asthma    COVID 09/2019   DDD (degenerative disc disease), lumbar    Mild DJD of L spine on x-ray 01/2017.   Eczema    Hyperlipidemia    Left calf atrophy 01/2017   with mild weakness of L calf.  No correlation with MRI L spine findings.  02/2018 NCS/EMG motor changes (radiculopathy) affecting the left L3-S1 nerve root/segments.  MR T spine unremarkable. Rpt eval/2nd opinion also unrevealing.   Left lumbosacral radiculopathy    Chronic LBP with L leg paresthesias and weakness.  [facility] 2019-->MRI L spine showed disc protrusion at the right lateral recess at L4-5 causing right L5 nerve root compression.  Ref to Dr. Allena Katz,  NCS/EMGs + MRI T spine.  NCS/EMG 03/13/18->active chronic intraspinal canal lesion (i.e. radiculopathy)affecting the left L3-S1 nerve root/segments.  T spine MRI unremarkable.   Seasonal allergies     Past Surgical History:  Procedure Laterality Date   COLONOSCOPY  05/20/2015   Normal. Recall 2027 (Dr. Kinnie Scales)   NCS/EMG  03/14/2018   The electrophysiologic findings are suggestive of an active chronic intraspinal canal lesion (i.e. radiculopathy) affecting the left L3-S1 nerve root/segments.  T spine MR unremarkable.   Wisdon teeth extraction       Family History  Problem Relation Age of Onset   Diabetes Mother    Hypertension Mother    Arthritis Mother    Renal cancer Father 29   Lymphoma Father    BRCA 1/2 Father        BRCA2 mutation positive   Other Father        CHEK2 mutation positive   Asthma Father    Allergies Father    Breast cancer Paternal Grandmother 15        d.60   Uterine cancer Paternal Grandmother 36   Cancer Paternal Grandfather        unknown type    Social History   Socioeconomic History   Marital status: Married    Spouse name: Not on file   Number of children: 2   Years of education: 16   Highest education level: Bachelor's degree (e.g., BA, AB, BS)  Occupational History   Occupation: Banker  Tobacco Use   Smoking status: Never   Smokeless tobacco: Never  Vaping Use   Vaping status: Never Used  Substance and Sexual Activity   Alcohol use: No   Drug use: No   Sexual activity: Not on file  Other Topics Concern   Not on file  Social History Narrative   Married, 2 sons.   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   Tob: never.   Alc: rare   Drugs none.   Lives with wife in a 2 story home.     Social Determinants of Health   Financial Resource Strain: Low Risk  (02/27/2023)   Overall Financial Resource Strain (CARDIA)    Difficulty of Paying Living Expenses: Not hard at all  Food Insecurity: No Food Insecurity (02/27/2023)  Hunger Vital Sign    Worried About Running Out of Food in the Last Year: Never true    Ran Out of Food in the Last Year: Never true  Transportation Needs: No Transportation Needs (02/27/2023)   PRAPARE - Administrator, Civil Service (Medical): No    Lack of Transportation (Non-Medical): No  Physical Activity: Sufficiently Active (02/27/2023)   Exercise Vital Sign    Days of Exercise per Week: 3 days    Minutes of Exercise per Session: 50 min  Stress: No Stress Concern Present (02/27/2023)   [employer] of Occupational Health - Occupational Stress Questionnaire    Feeling of Stress : Only a little  Social Connections: Moderately Integrated (02/27/2023)   Social Connection and Isolation Panel [NHANES]    Frequency of Communication with Friends and Family: More than three times a week    Frequency of  Social Gatherings with Friends and Family: Once a week    Attends Religious Services: 1 to 4 times per year    Active Member of [employer] or Organizations: No    Attends Engineer, structural: Not on file    Marital Status: Married  Intimate Partner Violence: Unknown (06/25/2021)   Received from [employer], [facility]   HITS    Physically Hurt: Not on file    Insult or Talk Down To: Not on file    Threaten Physical Harm: Not on file    Scream or Curse: Not on file    No outpatient medications prior to visit.   No facility-administered medications prior to visit.    Allergies  Allergen Reactions   Influenza Vaccines Palpitations, Shortness Of Breath and Swelling    Other reaction(s): Dizziness (intolerance) PT STATES IS ALLERGIC TO ALL VACCINES    Penicillins     Review of Systems  Constitutional:  Negative for appetite change, chills, fatigue and fever.  HENT:  Negative for congestion, dental problem, ear pain and sore throat.   Eyes:  Negative for discharge, redness and visual disturbance.  Respiratory:  Negative for cough, chest tightness, shortness of breath and wheezing.   Cardiovascular:  Negative for chest pain, palpitations and leg swelling.  Gastrointestinal:  Negative for abdominal pain, blood in stool, diarrhea, nausea and vomiting.  Genitourinary:  Negative for difficulty urinating, dysuria, flank pain, frequency, hematuria and urgency.  Musculoskeletal:  Negative for arthralgias, back pain, joint swelling, myalgias and neck stiffness.  Skin:  Negative for pallor and rash.  Neurological:  Negative for dizziness, speech difficulty, weakness and headaches.  Hematological:  Negative for adenopathy. Does not bruise/bleed easily.  Psychiatric/Behavioral:  Negative for confusion and sleep disturbance. The patient is not nervous/anxious.     PE;    02/28/2023    1:03 PM 09/27/2022   11:24 AM 09/14/2021    9:53 AM  Vitals with BMI  Height 5' 9.5" 5\' 9"  5\' 9"    Weight 172 lbs 3 oz 176 lbs 2 oz 175 lbs  BMI 25.07 26 25.83  Systolic 124 102 093  Diastolic 76 64 75  Pulse 60 62 72     Gen: Alert, well appearing.  Patient is oriented to person, place, time, and situation. AFFECT: pleasant, lucid thought and speech. ENT: Ears: EACs clear, normal epithelium.  TMs with good light reflex and landmarks bilaterally.  Eyes: no injection, icteris, swelling, or exudate.  EOMI, PERRLA. Nose: no drainage or turbinate edema/swelling.  No injection or focal lesion.  Mouth: lips without lesion/swelling.  Oral mucosa  pink and moist.  Dentition intact and without obvious caries or gingival swelling.  Oropharynx without erythema, exudate, or swelling.  Neck: supple/nontender.  No LAD, mass, or TM.  Carotid pulses 2+ bilaterally, without bruits. CV: RRR, no m/r/g.   LUNGS: CTA bilat, nonlabored resps, good aeration in all lung fields. ABD: soft, NT, ND, BS normal.  No hepatospenomegaly or mass.  No bruits. EXT: no clubbing, cyanosis, or edema.  Left calf atrophy noted.  No strength deficit. Musculoskeletal: no joint swelling, erythema, warmth, or tenderness.  ROM of all joints intact. Skin - no sores or suspicious lesions or rashes or color changes  Pertinent labs:  Lab Results  Component Value Date   TSH 1.76 03/06/2021   Lab Results  Component Value Date   WBC 8.0 01/08/2021   HGB 13.9 01/08/2021   HCT 43.0 01/08/2021   MCV 85.9 01/08/2021   PLT 205.0 01/08/2021   Lab Results  Component Value Date   CREATININE 1.13 01/08/2021   BUN 15 01/08/2021   NA 140 01/08/2021   K 3.9 01/08/2021   CL 103 01/08/2021   CO2 30 01/08/2021   Lab Results  Component Value Date   ALT 18 02/11/2020   AST 21 02/11/2020   ALKPHOS 51 02/11/2020   BILITOT 1.0 02/11/2020   Lab Results  Component Value Date   CHOL 215 (H) 03/06/2021   Lab Results  Component Value Date   HDL 52 03/06/2021   Lab Results  Component Value Date   LDLCALC 144 (H) 03/06/2021    Lab Results  Component Value Date   TRIG 88 03/06/2021   Lab Results  Component Value Date   CHOLHDL 4.1 03/06/2021   Lab Results  Component Value Date   PSA 1.51 03/06/2021   PSA 1.68 02/11/2020   PSA 3.10 02/03/2017   ASSESSMENT AND PLAN:   Health maintenance exam: Reviewed age and gender appropriate health maintenance issues (prudent diet, regular exercise, health risks of tobacco and excessive alcohol, use of seatbelts, fire alarms in home, use of sunscreen).  Also reviewed age and gender appropriate health screening as well as vaccine recommendations. Vaccines: Tdap UTD. Flu->declined. Labs: Fasting health panel and PSA. Prostate ca screening: PSA ordered. Colon ca screening: normal TCS 2017, recall 2027.  An After Visit Summary was printed and given to the patient.  FOLLOW UP:  Return in about 1 year (around 02/28/2024) for annual CPE (fasting).  Signed:  Santiago Bumpers, MD           02/28/2023

## 2023-02-28 NOTE — Patient Instructions (Signed)
Health Maintenance, Male Adopting a healthy lifestyle and getting preventive care are important in promoting health and wellness. Ask your health care provider about: The right schedule for you to have regular tests and exams. Things you can do on your own to prevent diseases and keep yourself healthy. What should I know about diet, weight, and exercise? Eat a healthy diet  Eat a diet that includes plenty of vegetables, fruits, low-fat dairy products, and lean protein. Do not eat a lot of foods that are high in solid fats, added sugars, or sodium. Maintain a healthy weight Body mass index (BMI) is a measurement that can be used to identify possible weight problems. It estimates body fat based on height and weight. Your health care provider can help determine your BMI and help you achieve or maintain a healthy weight. Get regular exercise Get regular exercise. This is one of the most important things you can do for your health. Most adults should: Exercise for at least 150 minutes each week. The exercise should increase your heart rate and make you sweat (moderate-intensity exercise). Do strengthening exercises at least twice a week. This is in addition to the moderate-intensity exercise. Spend less time sitting. Even light physical activity can be beneficial. Watch cholesterol and blood lipids Have your blood tested for lipids and cholesterol at 59 years of age, then have this test every 5 years. You may need to have your cholesterol levels checked more often if: Your lipid or cholesterol levels are high. You are older than 59 years of age. You are at high risk for heart disease. What should I know about cancer screening? Many types of cancers can be detected early and may often be prevented. Depending on your health history and family history, you may need to have cancer screening at various ages. This may include screening for: Colorectal cancer. Prostate cancer. Skin cancer. Lung  cancer. What should I know about heart disease, diabetes, and high blood pressure? Blood pressure and heart disease High blood pressure causes heart disease and increases the risk of stroke. This is more likely to develop in people who have high blood pressure readings or are overweight. Talk with your health care provider about your target blood pressure readings. Have your blood pressure checked: Every 3-5 years if you are 18-39 years of age. Every year if you are 40 years old or older. If you are between the ages of 65 and 75 and are a current or former smoker, ask your health care provider if you should have a one-time screening for abdominal aortic aneurysm (AAA). Diabetes Have regular diabetes screenings. This checks your fasting blood sugar level. Have the screening done: Once every three years after age 45 if you are at a normal weight and have a low risk for diabetes. More often and at a younger age if you are overweight or have a high risk for diabetes. What should I know about preventing infection? Hepatitis B If you have a higher risk for hepatitis B, you should be screened for this virus. Talk with your health care provider to find out if you are at risk for hepatitis B infection. Hepatitis C Blood testing is recommended for: Everyone born from 1945 through 1965. Anyone with known risk factors for hepatitis C. Sexually transmitted infections (STIs) You should be screened each year for STIs, including gonorrhea and chlamydia, if: You are sexually active and are younger than 59 years of age. You are older than 59 years of age and your   health care provider tells you that you are at risk for this type of infection. Your sexual activity has changed since you were last screened, and you are at increased risk for chlamydia or gonorrhea. Ask your health care provider if you are at risk. Ask your health care provider about whether you are at high risk for HIV. Your health care provider  may recommend a prescription medicine to help prevent HIV infection. If you choose to take medicine to prevent HIV, you should first get tested for HIV. You should then be tested every 3 months for as long as you are taking the medicine. Follow these instructions at home: Alcohol use Do not drink alcohol if your health care provider tells you not to drink. If you drink alcohol: Limit how much you have to 0-2 drinks a day. Know how much alcohol is in your drink. In the [place] one drink equals one 12 oz bottle of beer (355 mL), one 5 oz glass of wine (148 mL), or one 1 oz glass of hard liquor (44 mL). Lifestyle Do not use any products that contain nicotine or tobacco. These products include cigarettes, chewing tobacco, and vaping devices, such as e-cigarettes. If you need help quitting, ask your health care provider. Do not use street drugs. Do not share needles. Ask your health care provider for help if you need support or information about quitting drugs. General instructions Schedule regular health, dental, and eye exams. Stay current with your vaccines. Tell your health care provider if: You often feel depressed. You have ever been abused or do not feel safe at home. Summary Adopting a healthy lifestyle and getting preventive care are important in promoting health and wellness. Follow your health care provider's instructions about healthy diet, exercising, and getting tested or screened for diseases. Follow your health care provider's instructions on monitoring your cholesterol and blood pressure. This information is not intended to replace advice given to you by your health care provider. Make sure you discuss any questions you have with your health care provider. Document Revised: 07/28/2020 Document Reviewed: 07/28/2020 Elsevier Patient Education  2024 Elsevier Inc.  

## 2023-03-01 ENCOUNTER — Encounter: Payer: Self-pay | Admitting: Family Medicine

## 2023-07-05 ENCOUNTER — Ambulatory Visit (INDEPENDENT_AMBULATORY_CARE_PROVIDER_SITE_OTHER): Admitting: Family Medicine

## 2023-07-05 ENCOUNTER — Encounter: Payer: Self-pay | Admitting: Family Medicine

## 2023-07-05 VITALS — BP 124/78 | HR 71 | Temp 98.3°F | Wt 175.0 lb

## 2023-07-05 DIAGNOSIS — M5440 Lumbago with sciatica, unspecified side: Secondary | ICD-10-CM | POA: Diagnosis not present

## 2023-07-05 DIAGNOSIS — M533 Sacrococcygeal disorders, not elsewhere classified: Secondary | ICD-10-CM | POA: Diagnosis not present

## 2023-07-05 MED ORDER — PREDNISONE 20 MG PO TABS: ORAL_TABLET | ORAL | 0 refills | Status: DC

## 2023-07-05 MED ORDER — METHYLPREDNISOLONE ACETATE 80 MG/ML IJ SUSP
80.0000 mg | Freq: Once | INTRAMUSCULAR | Status: AC
Start: 2023-07-05 — End: 2023-07-05
  Administered 2023-07-05: 80 mg via INTRAMUSCULAR

## 2023-07-05 MED ORDER — TIZANIDINE HCL 4 MG PO TABS: 2.0000 mg | ORAL_TABLET | Freq: Three times a day (TID) | ORAL | 0 refills | Status: DC | PRN

## 2023-07-05 NOTE — Progress Notes (Signed)
 Troy Taylor , September 17, 1965, 58 y.o., male MRN: [id number] Patient Care Team    Relationship Specialty Notifications Start End  McGowen, Maryjean Morn, MD PCP - General Family Medicine  02/04/17   Sharrell Ku, MD Consulting Physician Gastroenterology  02/02/17   Glendale Chard, DO Consulting Physician Neurology  02/19/18     Chief Complaint  Patient presents with   Back Pain    Ongoing for the past few weeks, has gotten worse over the last 3 days; lower back, spreads down left hip/leg. Severe pain, causing difficulty in mobility. Pt has taken tylenol but found no relief.      Subjective: Troy Taylor is a 58 y.o. Pt presents for an OV with complaints of left-sided lower back pain of 2-3 weeks duration.  Over the last 2-3 days his back pain has worsened and he is having difficulty standing up straight.  Reports radiating discomfort down his left leg and into his foot.  He denies any recent injury or overactivity.  He has had chronic issues with his thoracic and lumbar spine over the years seen by neurology and orthopedics.  He has never had any prior surgeries.  He does not take daily medicine.   He had an EMG completed in 2019 which was suggestive of left L3-S1 intracanal lesion.  MR RI lumbar spine with multilevel root contact with compression at L5-S1 root displacement.  He has been worked up by neurology for left gastrocnemius atrophy of unknown cause, and has reported neurological exam abnormalities muscle strength and deep tendon reflexes left lower extremity compared to right.  01/02/2021 MRI lumbar spine Alignment: Trace retrolisthesis at L4-L5.  Vertebrae:  Vertebral body heights are maintained. No marrow signal abnormalities to suggest neoplasm. Mild presumed degenerative endplate edema at J4-N8. Likely small venous malformation within the L4 vertebral body.  Conus medullaris:  In normal position. Normal signal and contour.  Degenerative changes:  T12-L1:  No substantial  canal or foraminal stenosis.  L1-L2:  No substantial canal or foraminal stenosis.  L2-L3:  Mild disc bulging and facet hypertrophy but no substantial canal or foraminal stenosis.  L3-L4:  Disc bulge, ligamentum flavum thickening, and facet hypertrophy contributes to mild canal stenosis. Likely small foraminal disc herniation on the right which contributes to mild to moderate foraminal stenosis on the right and there is mild stenosis on the left. Disc bulge contacts the exited nerves in the far lateral space bilaterally.  L4-L5:  Degenerative disc disease with disc height loss and posterior endplate spurring and disc bulge and mild ligamentum flavum thickening and facet hypertrophy overall results in mild spinal canal stenosis and moderate bilateral foraminal stenosis.  L5-S1:  Mild disc bulging and a left paracentral disc herniation but no significant canal or foraminal stenosis.      02/28/2023    1:05 PM 09/27/2022   11:24 AM 03/06/2021    2:38 PM 02/11/2020    1:57 PM 02/02/2017    2:08 PM  Depression screen PHQ 2/9  Decreased Interest 0 0 0 0 0  Down, Depressed, Hopeless 0 0 0 0 0  PHQ - 2 Score 0 0 0 0 0  Altered sleeping  0     Tired, decreased energy  0     Change in appetite  0     Feeling bad or failure about yourself   0     Trouble concentrating  0     Moving slowly or fidgety/restless  0  Suicidal thoughts  0     PHQ-9 Score  0     Difficult doing work/chores  Not difficult at all       Allergies  Allergen Reactions   Influenza Vaccines Palpitations, Shortness Of Breath and Swelling    Other reaction(s): Dizziness (intolerance) PT STATES IS ALLERGIC TO ALL VACCINES    Penicillins    Social History   Social History Narrative   Married, 2 sons.   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   Tob: never.   Alc: rare   Drugs none.   Lives with wife in a 2 story home.     Past Medical History:   Diagnosis Date   Allergy    pollen, shellfish   Asthma    COVID 09/2019   DDD (degenerative disc disease), lumbar    Mild DJD of L spine on x-ray 01/2017.   Eczema    Hyperlipidemia    2024 LDL 173.  Framingham risk 7%.   Left calf atrophy 01/2017   with mild weakness of L calf.  No correlation with MRI L spine findings.  02/2018 NCS/EMG motor changes (radiculopathy) affecting the left L3-S1 nerve root/segments.  MR T spine unremarkable. Rpt eval/2nd opinion also unrevealing.   Left lumbosacral radiculopathy    Chronic LBP with L leg paresthesias and weakness.  [facility] 2019-->MRI L spine showed disc protrusion at the right lateral recess at L4-5 causing right L5 nerve root compression.  Ref to Dr. Lydia Sams,  NCS/EMGs + MRI T spine.  NCS/EMG 03/13/18->active chronic intraspinal canal lesion (i.e. radiculopathy)affecting the left L3-S1 nerve root/segments.  T spine MRI unremarkable.   Seasonal allergies    Past Surgical History:  Procedure Laterality Date   COLONOSCOPY  05/20/2015   Normal. Recall 2027 (Dr. Andriette Keeling)   NCS/EMG  03/14/2018   The electrophysiologic findings are suggestive of an active chronic intraspinal canal lesion (i.e. radiculopathy) affecting the left L3-S1 nerve root/segments.  T spine MR unremarkable.   Wisdon teeth extraction      Family History  Problem Relation Age of Onset   Diabetes Mother    Hypertension Mother    Arthritis Mother    Renal cancer Father 54   Lymphoma Father    BRCA 1/2 Father        BRCA2 mutation positive   Other Father        CHEK2 mutation positive   Asthma Father    Allergies Father    Breast cancer Paternal Grandmother 26       d.60   Uterine cancer Paternal Grandmother 68   Cancer Paternal Grandfather        unknown type   Allergies as of 07/05/2023       Reactions   Influenza Vaccines Palpitations, Shortness Of Breath, Swelling   Other reaction(s): Dizziness (intolerance) PT STATES IS ALLERGIC TO ALL VACCINES     Penicillins         Medication List    as of July 05, 2023  2:35 PM   You have not been prescribed any medications.     All past medical history, surgical history, allergies, family history, immunizations andmedications were updated in the EMR today and reviewed under the history and medication portions of their EMR.     ROS Negative, with the exception of above mentioned in HPI   Objective:  BP 124/78   Pulse 71   Temp 98.3 F (36.8 C)  Wt 175 lb (79.4 kg)   SpO2 98%   BMI 25.47 kg/m  Body mass index is 25.47 kg/m.  Physical Exam Vitals and nursing note reviewed.  Constitutional:      General: He is not in acute distress.    Appearance: Normal appearance. He is not ill-appearing, toxic-appearing or diaphoretic.  HENT:     Head: Normocephalic and atraumatic.  Eyes:     General: No scleral icterus.       Right eye: No discharge.        Left eye: No discharge.     Extraocular Movements: Extraocular movements intact.     Pupils: Pupils are equal, round, and reactive to light.  Musculoskeletal:     Lumbar back: Spasms and tenderness present. No bony tenderness.       Back:     Comments: Lumbar spine: No spinal bony tenderness.  No erythema.  Mild she took she changed her spasm overall lower lumbar area.  Tender to palpation left SI joint.  MS 5/5 right lower extremity 4+/5 left lower extremity, which is his normal. Full range of motion testing SLR unable to be completed secondary to patient discomfort today.  Skin:    General: Skin is warm and dry.     Coloration: Skin is not jaundiced or pale.     Findings: No rash.  Neurological:     Mental Status: He is alert and oriented to person, place, and time. Mental status is at baseline.  Psychiatric:        Mood and Affect: Mood normal.        Behavior: Behavior normal.        Thought Content: Thought content normal.        Judgment: Judgment normal.      No results found. No results found. No results found  for this or any previous visit (from the past 24 hours).  Assessment/Plan: Troy Taylor is a 58 y.o. male present for OV for  Lumbar pain (Primary)/SI joint pain/lumbar radiculopathy/sciatica Rest  lumbar spine.  Heat application We discussed sciatica discomfort versus lumbar radiculopathy.  He has history of degenerative changes and mild lumbar stenosis from MRI 2022, which certainly could be contributing to his symptoms today. IM Depo-Medrol 80 mg provided today, start short course of prednisone taper tomorrow. Zanaflex 2-4 mg every 8 hours as needed - methylPREDNISolone acetate (DEPO-MEDROL) injection 80 mg Follow-up in 2 weeks with PCP for further evaluation.  Sooner if worsening.  Reviewed expectations re: course of current medical issues. Discussed self-management of symptoms. Outlined signs and symptoms indicating need for more acute intervention. Patient verbalized understanding and all questions were answered. Patient received an After-Visit Summary.    No orders of the defined types were placed in this encounter.  No orders of the defined types were placed in this encounter.  Referral Orders  No referral(s) requested today     Note is dictated utilizing voice recognition software. Although note has been proof read prior to signing, occasional typographical errors still can be missed. If any questions arise, please do not hesitate to call for verification.   electronically signed by:  Napolean Backbone, DO  [facility] [place]

## 2023-07-05 NOTE — Patient Instructions (Addendum)
 Return in about 2 weeks (around 07/19/2023) for Back pain follow-up with PCP.        Great to see you today.  I have refilled the medication(s) we provide.   If labs were collected or images ordered, we will inform you of  results once we have received them and reviewed. We will contact you either by echart message, or telephone call.  Please give ample time to the testing facility, and our office to run,  receive and review results. Please do not call inquiring of results, even if you can see them in your chart. We will contact you as soon as we are able. If it has been over 1 week since the test was completed, and you have not yet heard from us , then please call us .    - echart message- for normal results that have been seen by the patient already.   - telephone call: abnormal results or if patient has not viewed results in their echart.  If a referral to a specialist was entered for you, please call us  in 2 weeks if you have not heard from the specialist office to schedule.

## 2023-07-06 ENCOUNTER — Ambulatory Visit: Payer: Self-pay

## 2023-07-06 ENCOUNTER — Other Ambulatory Visit: Payer: Self-pay | Admitting: Family Medicine

## 2023-07-06 MED ORDER — HYDROCODONE-ACETAMINOPHEN 5-325 MG PO TABS: 1.0000 | ORAL_TABLET | Freq: Four times a day (QID) | ORAL | 0 refills | Status: DC | PRN

## 2023-07-06 MED ORDER — HYDROCODONE-ACETAMINOPHEN 5-325 MG PO TABS
1.0000 | ORAL_TABLET | Freq: Four times a day (QID) | ORAL | 0 refills | Status: DC | PRN
Start: 2023-07-06 — End: 2023-07-06

## 2023-07-06 MED ORDER — HYDROCODONE-ACETAMINOPHEN 5-325 MG PO TABS: ORAL_TABLET | ORAL | 0 refills | Status: DC

## 2023-07-06 NOTE — Telephone Encounter (Signed)
 Vicodin eRx'd. Needs to f/u with me if pain not any better after running out of this med. Watch out for sedation/impairment. Do not take at the same time as tizanidine.

## 2023-07-06 NOTE — Telephone Encounter (Signed)
 Chief Complaint: lower back pain Symptoms: back pain  Frequency: x2-3 weeks Pertinent Negatives: Patient denies injury Disposition: [x] ED /[] Urgent Care (no appt availability in office) / [x] Appointment(In office/virtual)/ []  [facility]/ [] Home Care/ [x] Refused Recommended Disposition /[] [facility]/ [x]  Follow-up with PCP Additional Notes: pt's wife states that pt is in worse pain this morning. States the didn't sleep last night and now can't get up off the couch because of the pain. States he is taking the steroids and ibuprofen and muscle relaxer but not of it is helping. Advised ED for severe pain, she refused an is requesting something for the pain be sent in.   Copied from CRM [id number]. Topic: Clinical - Red Word Triage >> Jul 06, 2023  8:02 AM [place] A wrote: [facility] that prompted transfer to Nurse Triage: Patient's wife called because patient came in yesterday but is not feeling better. Still having severe back pain Reason for Disposition  [1] SEVERE pain (e.g., excruciating) AND [2] not improved 2 hours after pain medicine/ice packs  Answer Assessment - Initial Assessment Questions 1. ONSET: "When did the pain begin?"      2-3 weeks 2. LOCATION: "Where does it hurt?" (upper, mid or lower back)     Lower back 3. SEVERITY: "How bad is the pain?"  (e.g., Scale 1-10; mild, moderate, or severe)   - MILD (1-3): Doesn't interfere with normal activities.    - MODERATE (4-7): Interferes with normal activities or awakens from sleep.    - SEVERE (8-10): Excruciating pain, unable to do any normal activities.      10/10 4. PATTERN: "Is the pain constant?" (e.g., yes, no; constant, intermittent)      constant 5. RADIATION: "Does the pain shoot into your legs or somewhere else?"     leg  7. BACK OVERUSE:  "Any recent lifting of heavy objects, strenuous work or exercise?"     no 8. MEDICINES: "What have you taken so far for the pain?" (e.g., nothing, acetaminophen,  NSAIDS)     Steriods and muscle relaxer 9. NEUROLOGIC SYMPTOMS: "Do you have any weakness, numbness, or problems with bowel/bladder control?"     no 10. OTHER SYMPTOMS: "Do you have any other symptoms?" (e.g., fever, abdomen pain, burning with urination, blood in urine)       no  Protocols used: Back Pain-A-AH, Back Injury-A-AH

## 2023-07-06 NOTE — Telephone Encounter (Signed)
 Pt advised of provider instructions.

## 2023-07-12 ENCOUNTER — Telehealth: Payer: Self-pay

## 2023-07-12 ENCOUNTER — Encounter: Payer: Self-pay | Admitting: Family Medicine

## 2023-07-12 DIAGNOSIS — M5432 Sciatica, left side: Secondary | ICD-10-CM

## 2023-07-12 DIAGNOSIS — M5459 Other low back pain: Secondary | ICD-10-CM

## 2023-07-12 NOTE — Telephone Encounter (Signed)
 Don't have to wait. I entered PT order.

## 2023-07-12 NOTE — Telephone Encounter (Signed)
 Noted: nurse phone contact with patient for TCM. Signed:  Arletha Lady, MD           07/12/2023

## 2023-07-12 NOTE — Transitions of Care (Post Inpatient/ED Visit) (Signed)
   07/12/2023  Name: Troy Taylor MRN: [id number] DOB: 1965-10-04  Today's TOC FU Call Status: Today's TOC FU Call Status:: Successful TOC FU Call Completed TOC FU Call Complete Date: 07/12/23 Patient's Name and Date of Birth confirmed.  Transition Care Management Follow-up Telephone Call Date of Discharge: 07/11/23 Discharge Facility: Other [facility]) Name of Other (Non[facility]) Discharge Facility: [facility] Type of Discharge: Inpatient Admission Primary Inpatient Discharge Diagnosis:: sciaticia How have you been since you were released from the hospital?: Better Any questions or concerns?: No  Items Reviewed: Did you receive and understand the discharge instructions provided?: Yes Medications obtained,verified, and reconciled?: Yes (Medications Reviewed) Any new allergies since your discharge?: No Dietary orders reviewed?: NA Do you have support at home?: Yes People in Home [RPT]: spouse  Medications Reviewed Today: Medications Reviewed Today     Reviewed by Darrall Ellison, LPN (Licensed Practical Nurse) on 07/12/23 at 1031  Med List Status: <None>   Medication Order Taking? Sig Documenting Provider Last Dose Status Informant  HYDROcodone -acetaminophen  (NORCO/VICODIN) 5-325 MG tablet [id number]  1-2 tabs po q6h prn severe pain McGowen, Minetta Aly, MD  Active   predniSONE  (DELTASONE ) 20 MG tablet [id number]  60 mg x1d, 40 mg x3d, 20 mg x2d, 10 mg x2d Kuneff, Renee A, DO  Active   tiZANidine  (ZANAFLEX ) 4 MG tablet [id number]  Take 0.5-1 tablets (2-4 mg total) by mouth every 8 (eight) hours as needed for muscle spasms. Mariel Shope, DO  Active             Home Care and Equipment/Supplies: Were Home Health Services Ordered?: Yes Name of Home Health Agency:: unknown Has Agency set up a time to come to your home?: No Any new equipment or medical supplies ordered?: Yes Name of Medical supply agency?: unknown Were you able to get the equipment/medical supplies?: Yes Do  you have any questions related to the use of the equipment/supplies?: No  Functional Questionnaire: Do you need assistance with bathing/showering or dressing?: Yes Do you need assistance with meal preparation?: No Do you need assistance with eating?: No Do you have difficulty maintaining continence: No Do you need assistance with getting out of bed/getting out of a chair/moving?: Yes Do you have difficulty managing or taking your medications?: No  Follow up appointments reviewed: PCP Follow-up appointment confirmed?: Yes Date of PCP follow-up appointment?: 07/15/23 Follow-up Provider: [facility] Follow-up appointment confirmed?: No Reason Specialist Follow-Up Not Confirmed: Patient has Specialist Provider Number and will Call for Appointment Do you need transportation to your follow-up appointment?: No Do you understand care options if your condition(s) worsen?: Yes-patient verbalized understanding    SIGNATURE Darrall Ellison, LPN [facility] Nurse Health Advisor Direct Dial [phone]

## 2023-07-14 NOTE — Patient Instructions (Signed)

## 2023-07-15 ENCOUNTER — Encounter: Payer: Self-pay | Admitting: Family Medicine

## 2023-07-15 ENCOUNTER — Telehealth: Admitting: Family Medicine

## 2023-07-15 DIAGNOSIS — M5432 Sciatica, left side: Secondary | ICD-10-CM

## 2023-07-15 DIAGNOSIS — M7918 Myalgia, other site: Secondary | ICD-10-CM

## 2023-07-15 DIAGNOSIS — K5903 Drug induced constipation: Secondary | ICD-10-CM

## 2023-07-15 DIAGNOSIS — T402X5A Adverse effect of other opioids, initial encounter: Secondary | ICD-10-CM

## 2023-07-15 MED ORDER — OXYCODONE HCL 10 MG PO TABS: ORAL_TABLET | ORAL | 0 refills | Status: DC

## 2023-07-15 NOTE — Progress Notes (Signed)
 Virtual Visit via Video Note  I connected with Troy Taylor  on 07/15/23 at 11:20 AM EDT by a video enabled telemedicine application and verified that I am speaking with the correct person using two identifiers.  Location patient: [place] Location provider:work or home office Persons participating in the virtual visit: patient, provider  I discussed the limitations and requested verbal permission for telemedicine visit. The patient expressed understanding and agreed to proceed.  HPI: Patient is a 58 y.o. male who presents for  hospital follow up, specifically [facility] face-to-face visit. Dates hospitalized: April 18 to July 12, 2023. Days since d/c from hospital: 3 days. Patient was discharged from hospital to home. Reason for admission to hospital: Intractable low back pain Date of interactive (phone) contact with patient and/or caregiver: 07/12/2023  I have reviewed patient's discharge summary plus pertinent specific notes, labs, and imaging from the hospitalization.  He presented with acute worsening of his chronic low back pain with sciatica.  Pain medication in the emergency department did not lead to improvement.  He could not function.  During hospitalization he got various IV pain medications, some of which actually caused his left hip and thigh to feel enormous and the pain increased. He recalls that morphine was one of the medications but cannot recall the other. After doing some diagnostics and starting some PT and getting him on a medication regiment that brought his pain down a little bit they felt he was okay for discharge home and they arranged outpatient PT, referred him to neurology and referred him to a pain management MD.  CT abdomen and pelvis without contrast on 07/08/2023: "IMPRESSION:  1.  Small bilateral nonobstructing renal calculi. No hydronephrosis.  2.  Left renal cyst  3.  Splenic cyst  4.  4.1 cm hepatic cyst  5.  Additional small more subtle hypodensity  in the dome of the liver, indeterminate. Suggest nonemergent follow-up ultrasound or hepatic protocol CT scan. "  MRI of the lumbar spine with without contrast 07/09/2023: "IMPRESSION:  Degenerative changes of the lumbar spine most prominent at L2-L3. "  Discharge medications: Gabapentin 100 mg 3 times daily, Robaxin 500 mg 4 times daily for 10 days, oxycodone  10 mg, 1 tab every 4 hours as needed up to 10 days, senna tabs as needed.  Currently: Troy Taylor's pain continues pretty severe but on oxycodone  every 5 hours, Robaxin regularly, and gabapentin he is now able to stand some and walk a little bit.  He has a hospital bed in his home. He has not had a bowel movement in 10 days.  He just started taking Senokot yesterday. The pain begins in the left buttock region and extends all the way down the thigh and intermittently down all the way to the ankle.  His left thigh feels numb.  Sitting on the left buttock makes it feel worse. He has not had any fever with his low back/buttock and leg pain.  No preceding overuse injury or trauma. No other joints have been giving him any problems.  Medication reconciliation was done today and patient  taking meds as recommended by discharging hospitalist/specialist.    ROS: ROS as above, plus-->  no CP, no SOB, no cough, no dizziness, no HAs, no rashes, no melena/hematochezia.  No polyuria or polydipsia.  No myalgias.  No focal weakness, paresthesias, or tremors.  No acute vision or hearing abnormalities.  No dysuria or unusual/new urinary urgency or frequency.  No n/v/d or abd pain.  No palpitations.  Current Outpatient Medications:    gabapentin (NEURONTIN) 100 MG capsule, Take 100 mg by mouth., Disp: , Rfl:    methocarbamol (ROBAXIN) 500 MG tablet, Take 500 mg by mouth., Disp: , Rfl:    Oxycodone  HCl 10 MG TABS, 1 tab po q4h as needed for pain, Disp: 40 tablet, Rfl: 0  EXAM:  VITALS per patient if applicable:     07/05/2023    2:24 PM 02/28/2023    1:03  PM 09/27/2022   11:24 AM  Vitals with BMI  Height  5' 9.5" 5\' 9"   Weight 175 lbs 172 lbs 3 oz 176 lbs 2 oz  BMI  25.07 26  Systolic 124 124 147  Diastolic 78 76 64  Pulse 71 60 62   GENERAL: alert, oriented, appears well and in no acute distress Sitting up in hospital bed in his living room HEENT: atraumatic, conjunttiva clear, no obvious abnormalities on inspection of external nose and ears  NECK: normal movements of the head and neck  LUNGS: on inspection no signs of respiratory distress, breathing rate appears normal, no obvious gross SOB, gasping or wheezing  CV: no obvious cyanosis  MS: moves all visible extremities without noticeable abnormality  PSYCH/NEURO: pleasant and cooperative, no obvious depression or anxiety, speech and thought processing grossly intact  LABS: none today  In hospital 07/08/2023: White blood cell 9.9, hemoglobin 14.3, platelets 220 Sodium 141 potassium 3.9, chloride 101, CO2 29, BUN 22, creatinine 0.98, calcium 10.0. AST 29, ALT 21, alkaline phosphatase 67, albumin 4.7.  Leukos 106.  ASSESSMENT AND PLAN:  Discussed the following assessment and plan:  #1 severe left low back/buttock and hip pain radiating down the left leg.  He has numbness in the left thigh as well. Imaging workup in the hospital did not show any pathology to correlate with his symptoms (MRI L spine). The pain is quite severe but improved with oxycodone  10 mg every 5 hours, methocarbamol as needed, and gabapentin 100 mg in the midday. Will see him in the office next week for exam and we will consider additional imaging such as MRI of the sacrum/SI joint, left hip radiograph, and will see how pain control is going. I have ordered a referral to Dr. Sharl Davies in physical medicine and rehab. I sent in a prescription for oxycodone  10 mg, 1 tab every 4 hours as needed, #40. I encouraged him to gradually increase his gabapentin to 3 of the 100 mg tabs 3 times a day.  #2 severe  constipation. Opioid-induced. He just started Senokot.  Take 2 tabs a day and I encouraged him to add MiraLAX powder over-the-counter if not seeing improvement in the next 1 to 2 days.  Also discussed the possibility of enema if he gets more more uncomfortable.  I discussed the assessment and treatment plan with the patient. The patient was provided an opportunity to ask questions and all were answered. The patient agreed with the plan and demonstrated an understanding of the instructions.   Medical decision making of high complexity utilized today.  F/u: next week in person  Signed:  Arletha Lady, MD           07/15/2023

## 2023-07-18 ENCOUNTER — Ambulatory Visit: Admitting: Family Medicine

## 2023-07-20 ENCOUNTER — Ambulatory Visit (INDEPENDENT_AMBULATORY_CARE_PROVIDER_SITE_OTHER): Admitting: Family Medicine

## 2023-07-20 ENCOUNTER — Encounter: Payer: Self-pay | Admitting: Family Medicine

## 2023-07-20 VITALS — BP 92/58 | HR 84 | Ht 69.5 in | Wt 161.8 lb

## 2023-07-20 DIAGNOSIS — G5702 Lesion of sciatic nerve, left lower limb: Secondary | ICD-10-CM

## 2023-07-20 DIAGNOSIS — M5432 Sciatica, left side: Secondary | ICD-10-CM | POA: Diagnosis not present

## 2023-07-20 DIAGNOSIS — M25552 Pain in left hip: Secondary | ICD-10-CM

## 2023-07-20 NOTE — Progress Notes (Signed)
 OFFICE VISIT  07/20/2023  CC:  Chief Complaint  Patient presents with   Back Pain    Follow up, last seen 4/15 by Dr.Kuneff; went to [facility] 4/18 admitted, stopped all medications prescribed 4/15    Patient is a 58 y.o. male who presents accompanied by his wife Rice Chamorro for 5-day follow-up intractable low back pain. A/P as of last visit: "#1 severe left low back/buttock and hip pain radiating down the left leg.  He has numbness in the left thigh as well. Imaging workup in the hospital did not show any pathology to correlate with his symptoms (MRI L spine). The pain is quite severe but improved with oxycodone  10 mg every 5 hours, methocarbamol as needed, and gabapentin 100 mg in the midday. Will see him in the office next week for exam and we will consider additional imaging such as MRI of the sacrum/SI joint, left hip radiograph, and will see how pain control is going. I have ordered a referral to Dr. Sharl Davies in physical medicine and rehab. I sent in a prescription for oxycodone  10 mg, 1 tab every 4 hours as needed, #40. I encouraged him to gradually increase his gabapentin to 3 of the 100 mg tabs 3 times a day.   #2 severe constipation. Opioid-induced. He just started Senokot.  Take 2 tabs a day and I encouraged him to add MiraLAX powder over-the-counter if not seeing improvement in the next 1 to 2 days.  Also discussed the possibility of enema if he gets more more uncomfortable."  INTERIM HX:  His pain has improved minimally.  It is focused in the left buttock and radiates down the back and side of his hip and extends down the left leg.  His has left thigh numbness.  He has his chronic weakness of the left calf but no new left leg weakness. He has been able to sit some as long as he does not put much weight on the left gluteal region. He can slowly stand and slowly walk. He does not have any significant pain in the lumbosacral spine. He is taking oxycodone  3 times a day, gabapentin 100  mg 2 times a day.  The methocarbamol causes too much sedation.  Has not made PT appointment yet because he wanted to wait and talk with me first. The referral to Dr. Sharl Davies is still being reviewed by their office.  His constipation persists.  He has only been taking Senokot.   Past Medical History:  Diagnosis Date   Allergy    pollen, shellfish   Asthma    COVID 09/2019   DDD (degenerative disc disease), lumbar    Mild DJD of L spine on x-ray 01/2017.   Eczema    Hyperlipidemia    2024 LDL 173.  Framingham risk 7%.   Left calf atrophy 01/2017   with mild weakness of L calf.  No correlation with MRI L spine findings.  02/2018 NCS/EMG motor changes (radiculopathy) affecting the left L3-S1 nerve root/segments.  MR T spine unremarkable. Rpt eval/2nd opinion also unrevealing.   Left lumbosacral radiculopathy    Chronic LBP with L leg paresthesias and weakness.  [facility] neuro 2019-->MRI L spine showed disc protrusion at the right lateral recess at L4-5 causing right L5 nerve root compression.  Ref to Dr. Lydia Sams,  NCS/EMGs + MRI T spine.  NCS/EMG 03/13/18->active chronic intraspinal canal lesion (i.e. radiculopathy)affecting the left L3-S1 nerve root/segments.  T spine MRI unremarkable.   Seasonal allergies     Past Surgical  History:  Procedure Laterality Date   COLONOSCOPY  05/20/2015   Normal. Recall 2027 (Dr. Andriette Keeling)   NCS/EMG  03/14/2018   The electrophysiologic findings are suggestive of an active chronic intraspinal canal lesion (i.e. radiculopathy) affecting the left L3-S1 nerve root/segments.  T spine MR unremarkable.   [place] teeth extraction       Outpatient Medications Prior to Visit  Medication Sig Dispense Refill   gabapentin (NEURONTIN) 100 MG capsule Take 100 mg by mouth.     methocarbamol (ROBAXIN) 500 MG tablet Take 500 mg by mouth.     Oxycodone  HCl 10 MG TABS 1 tab po q4h as needed for pain 40 tablet 0   No facility-administered medications prior to visit.     Allergies  Allergen Reactions   Influenza Vaccines Palpitations, Shortness Of Breath and Swelling    Other reaction(s): Dizziness (intolerance) PT STATES IS ALLERGIC TO ALL VACCINES    Penicillins     Review of Systems  As per HPI  PE:    07/20/2023   11:14 AM 07/05/2023    2:24 PM 02/28/2023    1:03 PM  Vitals with BMI  Height 5' 9.5"  5' 9.5"  Weight 161 lbs 13 oz 175 lbs 172 lbs 3 oz  BMI 23.56  25.07  Systolic 92 124 124  Diastolic 58 78 76  Pulse 84 71 60     Physical Exam  Gen: Alert, tired-appearing.  No distress but winces a lot and goes slow with standing, lying back, and walking.  Patient is oriented to person, place, time, and situation. He has significant tenderness to palpation over the left sciatic notch.  Elicits pain in the left gluteal region and radiating around the left hip and down into the left thigh to the knee level. Range of motion of the left hip elicits left buttock and hip and thigh pain in all motions except internal rotation.  Straight leg raise negative bilaterally. Left leg strength 5- out of 5 proximally.  5- out of 5 ankle dorsiflexion.  4 out of 5 ankle plantarflexion. Right leg strength 5 out of 5 proximally and distally.  LABS:  Last CBC Lab Results  Component Value Date   WBC 5.3 02/28/2023   HGB 14.1 02/28/2023   HCT 43.6 02/28/2023   MCV 88.0 02/28/2023   MCH 28.0 11/11/2016   RDW 14.4 02/28/2023   PLT 206.0 02/28/2023   Last metabolic panel Lab Results  Component Value Date   GLUCOSE 89 02/28/2023   NA 140 02/28/2023   K 4.3 02/28/2023   CL 103 02/28/2023   CO2 30 02/28/2023   BUN 17 02/28/2023   CREATININE 0.99 02/28/2023   GFR 83.63 02/28/2023   CALCIUM 9.6 02/28/2023   PROT 7.1 02/28/2023   ALBUMIN 4.8 02/28/2023   BILITOT 0.8 02/28/2023   ALKPHOS 63 02/28/2023   AST 28 02/28/2023   ALT 24 02/28/2023   ANIONGAP 7 11/11/2016   IMPRESSION AND PLAN:  #1 left sciatica, severe intensity. Recent MRI of the  L-spine showed some degenerative changes but no sign of spinal nerve impingement. His exam is pointing more more towards sciatic nerve pain, likely piriformis syndrome. Will facilitate urgent sports medicine referral and hopefully they will consider diagnostic/therapeutic piriformis syndrome injection. In the meantime we will hold off on PT since I do not think he will be able to participate currently. Continue oxycodone  10 mg up to 4 times a day as needed.  Increase gabapentin dosing slowly to a goal  of 600 mg twice a day.  Will get plain radiographs of the left hip as well.  #2 opioid-induced constipation. Encouraged him to get more aggressive with laxatives: He will take 2 Senokot every night and he will get MiraLAX to add to this.  An After Visit Summary was printed and given to the patient.  FOLLOW UP: Return for TBD depending on timing of specialist evaluation.  Signed:  Arletha Lady, MD           07/20/2023

## 2023-07-21 NOTE — Progress Notes (Signed)
 I, Miquel Amen, CMA acting as a scribe for Garlan Juniper, MD.  Jediah Gallis is a 58 y.o. male who presents to [facility] at [facility] today for LBP. On 4/18, pt was transported by EMS to [facility] and admitted, d/c 4/22. Pt locates pain to left side lower back. Back pain x 13 years, has been seen by Ortho and Neuro. C/O numbness from the hip to the knee. Has muscle atrophy in the left calf. Gets the most relief when laying down. Feels pulling sensation in the lower back when standing erect.   This is a long-term chronic problem first evaluated over 6 years ago in 2019.  He had an MRI and a nerve conduction study then that indicated multilevel lumbar nerve compression.  He subsequently had significant calf weakness and difficulty walking.  Now he notes continued calf weakness and difficulty walking but a lot of pain and discomfort around the hip and numbness into the anterior thigh.  He attributes this to having to walk with his foot outturned.  Radiating pain: L LE  LE numbness/tingling: L LE LE weakness: L LE Aggravates: sitting, standing, walking Treatments tried: Gabapentin, Oxycodone   Dx testing: 07/09/23 L-spine MRI  07/08/23 Ab/pelvis CT  Pertinent review of systems: No fevers or chills  Relevant historical information: Atrophy left lower leg muscles and lumbar radiculopathy and weakness.   Exam:  BP 112/70   Pulse 74   Ht 5' 9.5" (1.765 m)   Wt 163 lb (73.9 kg)   SpO2 98%   BMI 23.73 kg/m  General: Well Developed, well nourished, and in no acute distress.   MSK: L-spine nontender to palpation midline decreased lumbar motion. Lower extremity strength is decreased limited left hip flexion knee extension knee flexion and foot plantarflexion dorsiflexion.  Patient has significant atrophy left lower leg. Antalgic gait.    Lab and Radiology Results  Fredricka Jenny, MD - 07/09/2023  Formatting of this note might be different from the original.  MRI  lumbar spine without and with contrast:   INDICATION: Back pain.   TECHNIQUE: Sagittal and axial T1 and T2-weighted sequences were performed. Additional sagittal STIR images were performed. Additional sagittal and axial T1-weighted sequences were performed after intravenous injection of contrast. CONTRAST: 7.5 mL of GADOBUTROL  1 MMOL/ML IV SOLN.   COMPARISON: September 06, 2017   FINDINGS:  There are 5 nonrib-bearing lumbar-type vertebrae. The conus medullaris terminates at a normal level. Trace grade 1 retrolisthesis of L3 on L4. The vertebral body heights are maintained. Mild/moderate L4-L5 and L5-S1 intervertebral disc height loss. Trace edematous endplate signal changes at L2-L3 and L3-L4, nonspecific but likely Modic type I degenerative changes.   L1-2: No significant spinal canal or neural foraminal stenosis.  L2-3: Left subarticular/foraminal disc extrusion severely narrows the left lateral recess and contributes to moderate left neural foraminal stenosis. Mild spinal canal stenosis.  L3-4: No significant spinal canal stenosis. Facet arthrosis contributes to mild/moderate bilateral neural foraminal stenosis.  L4-5: No significant spinal canal stenosis. Mild bilateral neural foraminal stenosis.  L5-S1: No significant spinal canal or neural foraminal stenosis.    IMPRESSION:  Degenerative changes of the lumbar spine most prominent at L2-L3.   Electronically Signed by: Fredricka Jenny, MD on 07/09/2023 10:25 AM  I, Garlan Juniper, personally (independently) visualized and performed the interpretation of the images attached in this note.  X-ray images left hip obtained today personally and independently interpreted. No severe DJD.  No acute fractures. Await formal radiology review  Assessment and  Plan: 58 y.o. male with chronic left leg pain and weakness.  He has recent MRI and old nerve conduction study that indicate lumbar radiculopathy from L3 all the way down to S1.  He has significant  calf atrophy thought to be due to S1 radiculopathy.  This is affecting his gait which could be improved with an ankle-foot orthosis.  Plan to send an order to the [facility] for an ankle-foot orthosis.  Additionally he is having a lot of discomfort that would be coming from the left L3 nerve root.  This does show some significant neuroforaminal stenosis on his most recent MRI from [facility].  Will proceed with an epidural steroid injection which could help this area.  Lastly we talked about potential neurosurgical consultation.  He is reluctant to consider surgery but I do think that may be an option in the future.  Recheck in about a month.   PDMP not reviewed this encounter. Orders Placed This Encounter  Procedures   DG INJECT DIAG/THERA/INC NEEDLE/CATH/PLC EPI/LUMB/SAC W/IMG    Level and technique per radiology Suggest L, L-3    Standing Status:   Future    Expiration Date:   08/22/2023    Reason for Exam (SYMPTOM  OR DIAGNOSIS REQUIRED):   Low back pain    Preferred Imaging Location?:   [facility]   DG HIP UNILAT W OR W/O PELVIS 2-3 VIEWS LEFT    Standing Status:   Future    Number of Occurrences:   1    Expiration Date:   08/22/2023    Reason for Exam (SYMPTOM  OR DIAGNOSIS REQUIRED):   left hip pain    Preferred imaging location?:   [facility]   Meds ordered this encounter  Medications   AMBULATORY NON FORMULARY MEDICATION    Sig: AFO, L foot Dispense 1 Dx codeL M21.372 Use as needed    Dispense:  1 Device    Refill:  0     Discussed warning signs or symptoms. Please see discharge instructions. Patient expresses understanding.   The above documentation has been reviewed and is accurate and complete Garlan Juniper, M.D.

## 2023-07-22 ENCOUNTER — Encounter: Payer: Self-pay | Admitting: Physical Medicine & Rehabilitation

## 2023-07-22 ENCOUNTER — Ambulatory Visit (INDEPENDENT_AMBULATORY_CARE_PROVIDER_SITE_OTHER): Admitting: Family Medicine

## 2023-07-22 ENCOUNTER — Ambulatory Visit (INDEPENDENT_AMBULATORY_CARE_PROVIDER_SITE_OTHER)

## 2023-07-22 ENCOUNTER — Encounter: Payer: Self-pay | Admitting: Family Medicine

## 2023-07-22 VITALS — BP 112/70 | HR 74 | Ht 69.5 in | Wt 163.0 lb

## 2023-07-22 DIAGNOSIS — M21372 Foot drop, left foot: Secondary | ICD-10-CM

## 2023-07-22 DIAGNOSIS — M533 Sacrococcygeal disorders, not elsewhere classified: Secondary | ICD-10-CM

## 2023-07-22 DIAGNOSIS — M5416 Radiculopathy, lumbar region: Secondary | ICD-10-CM | POA: Diagnosis not present

## 2023-07-22 DIAGNOSIS — M62562 Muscle wasting and atrophy, not elsewhere classified, left lower leg: Secondary | ICD-10-CM | POA: Diagnosis not present

## 2023-07-22 MED ORDER — AMBULATORY NON FORMULARY MEDICATION: 0 refills | Status: AC

## 2023-07-22 NOTE — Patient Instructions (Addendum)
 Thank you for coming in today.   Please get an Xray today before you leave   I've faxed an order for an AFO for your foot to the [facility]  Please call [facility] ([facility]) at [phone] to schedule your spine injection.   Check back in 1 month

## 2023-07-25 ENCOUNTER — Encounter: Payer: Self-pay | Admitting: Family Medicine

## 2023-07-26 NOTE — Discharge Instructions (Signed)

## 2023-07-27 ENCOUNTER — Ambulatory Visit
Admission: RE | Admit: 2023-07-27 | Discharge: 2023-07-27 | Disposition: A | Discharge status: Home / Self Care | Source: Ambulatory Visit | Attending: Family Medicine | Admitting: Family Medicine

## 2023-07-27 DIAGNOSIS — M5416 Radiculopathy, lumbar region: Secondary | ICD-10-CM

## 2023-07-27 DIAGNOSIS — M21372 Foot drop, left foot: Secondary | ICD-10-CM

## 2023-07-27 MED ORDER — METHYLPREDNISOLONE ACETATE 40 MG/ML INJ SUSP (RADIOLOG
80.0000 mg | Freq: Once | INTRAMUSCULAR | Status: AC
Start: 2023-07-27 — End: 2023-07-27
  Administered 2023-07-27: 80 mg via EPIDURAL

## 2023-07-27 MED ORDER — IOPAMIDOL (ISOVUE-M 200) INJECTION 41%
1.0000 mL | Freq: Once | INTRAMUSCULAR | Status: AC
  Administered 2023-07-27: 1 mL via EPIDURAL

## 2023-08-03 ENCOUNTER — Ambulatory Visit: Payer: Self-pay | Admitting: Family Medicine

## 2023-08-03 NOTE — Progress Notes (Signed)
 Left hip x-ray looks normal to radiology.

## 2023-08-26 ENCOUNTER — Ambulatory Visit: Admitting: Family Medicine

## 2023-08-26 ENCOUNTER — Encounter: Payer: Self-pay | Admitting: Family Medicine

## 2023-08-26 VITALS — BP 104/78 | HR 56 | Ht 69.5 in | Wt 159.0 lb

## 2023-08-26 DIAGNOSIS — M5416 Radiculopathy, lumbar region: Secondary | ICD-10-CM

## 2023-08-26 MED ORDER — GABAPENTIN 300 MG PO CAPS: 300.0000 mg | ORAL_CAPSULE | Freq: Three times a day (TID) | ORAL | 3 refills | Status: AC | PRN

## 2023-08-26 NOTE — Progress Notes (Signed)
           I, Miquel Amen, CMA acting as a scribe for Garlan Juniper, MD.  Troy Taylor is a 58 y.o. male who presents to [facility] at [facility] today for f/u lumbar radiculopathy and L foot drop. Pt was last seen by Dr. Alease Hunter on 07/22/23 and a rx for AFO was sent to the [facility] and ESI ordered, done on 5/7.  Today, pt reports some improvement of pain with back injection. Continues to have numbness at the lateral thigh and foot. Starting PT next week, doing twice weekly. Denies new or worsening sx. Feels guarded with the back, concerned about flaring things back up. Wearing compression pants, this helps with the numbness.   Dx testing: 07/22/23 L hip XR 07/09/23 L-spine MRI  07/08/23 Ab/pelvis CT  Pertinent review of systems: No fevers or chills  Relevant historical information: Chronic low back pain and muscle atrophy.   Exam:  BP 104/78   Pulse (!) 56   Ht 5' 9.5" (1.765 m)   Wt 159 lb (72.1 kg)   SpO2 100%   BMI 23.14 kg/m  General: Well Developed, well nourished, and in no acute distress.   MSK: L-spine nontender palpation midline decreased lumbar motion.  Left calf muscle atrophy is present.    Assessment and Plan: 58 y.o. male with lumbar radiculopathy involving multiple nerve roots.  There was dominant nerve at his L3.  He also has some chronic S1 involvement.  He was not quite bad enough to benefit from a AFO when he was assessed by the [facility].  If this worsens consider AFO.  He had great response to epidural steroid injection.  His pain is not well-controlled enough that he no longer is taking oxycodone .  Will add and adjust gabapentin dose to see if we give a little more benefit.  Physical therapy is just getting started at [facility].  This should be quite helpful. Patient will keep me updated.  Happy to repeat epidural steroid injection.  If the interlaminar injection is not working well enough consider transforaminal approach next  time.  PDMP not reviewed this encounter. No orders of the defined types were placed in this encounter.  Meds ordered this encounter  Medications   gabapentin (NEURONTIN) 300 MG capsule    Sig: Take 1-2 capsules (300-600 mg total) by mouth 3 (three) times daily as needed.    Dispense:  90 capsule    Refill:  3     Discussed warning signs or symptoms. Please see discharge instructions. Patient expresses understanding.   The above documentation has been reviewed and is accurate and complete Garlan Juniper, M.D.

## 2023-08-26 NOTE — Patient Instructions (Addendum)
 Thank you for coming in today.   Can repeat epidural steroid injection, let me know  Continue physical therapy  Call the brace people if you would like a brace  Let me know how it goes

## 2023-09-09 ENCOUNTER — Encounter: Admitting: Physical Medicine & Rehabilitation

## 2023-09-30 ENCOUNTER — Encounter: Payer: Self-pay | Admitting: Family Medicine

## 2023-09-30 ENCOUNTER — Telehealth (INDEPENDENT_AMBULATORY_CARE_PROVIDER_SITE_OTHER): Admitting: Family Medicine

## 2023-09-30 DIAGNOSIS — G8929 Other chronic pain: Secondary | ICD-10-CM | POA: Diagnosis not present

## 2023-09-30 DIAGNOSIS — M5442 Lumbago with sciatica, left side: Secondary | ICD-10-CM

## 2023-09-30 NOTE — Progress Notes (Signed)
 Virtual Visit via Video Note  I connected with Troy Taylor  on 09/30/23 at  4:00 PM EDT by a video enabled telemedicine application and verified that I am speaking with the correct person using two identifiers.  Location patient: [place] Location provider:work or home office Persons participating in the virtual visit: patient, provider  I discussed the limitations and requested verbal permission for telemedicine visit. The patient expressed understanding and agreed to proceed.   HPI: 58 year old male being seen today for follow-up back pain. Since I saw him he got established with Dr. Joane.  He ended up getting a translaminar epidural steroid injection, L2-3 level on 07/27/2023. He had about 50% improvement in pain level but a little less improvement in the feeling of numbness and weakness in the left leg. He has been doing physical therapy regularly since the injection and does feel like things are gradually improving. He is no longer taking oxycodone  or gabapentin .  He takes Tylenol  occasionally.  His FMLA is up next Friday and he will need return to work documents completed. He has a lot of trouble still with prolonged sitting due to exacerbation of his pain and continues to have trouble with prolonged standing due to exacerbation of his weakness and left leg numbness. Lifting anything more than 5 to 10 pounds leads to his exacerbation of symptoms temporarily.  He feels like he can return to work but restricted to 11 AM to 6 PM daily and still needs to work from home.  He can go to the office for a half day occasionally if he needs to for meetings. He would need to avoid sitting for longer than 30 minutes or standing for longer than 30 minutes. He would need to lift less than 10 pounds. He could reconsider this schedule after being back at work for 1 month.  PMP AWARE reviewed today: most recent rx for oxycodone  10 mg was filled 07/15/2023, # 40, rx by me. No red flags.  ROS: See pertinent  positives and negatives per HPI.  Past Medical History:  Diagnosis Date   Allergy    pollen, shellfish   Asthma    DDD (degenerative disc disease), lumbar    Mild DJD of L spine on x-ray 01/2017.   Eczema    Hyperlipidemia    2024 LDL 173.  Framingham risk 7%.   Left calf atrophy 01/2017   with mild weakness of L calf.  No correlation with MRI L spine findings.  02/2018 NCS/EMG motor changes (radiculopathy) affecting the left L3-S1 nerve root/segments.  MR T spine unremarkable. Rpt eval/2nd opinion also unrevealing.   Left lumbosacral radiculopathy    Chronic LBP with L leg paresthesias and weakness.  [facility] neuro 2019-->MRI L spine showed disc protrusion at the right lateral recess at L4-5 causing right L5 nerve root compression.  Ref to Dr. Tobie,  NCS/EMGs + MRI T spine.  NCS/EMG 03/13/18->active chronic intraspinal canal lesion (i.e. radiculopathy)affecting the left L3-S1 nerve root/segments.  T spine MRI unremarkable.    Past Surgical History:  Procedure Laterality Date   COLONOSCOPY  05/20/2015   Normal. Recall 2027 (Dr. Luis)   NCS/EMG  03/14/2018   The electrophysiologic findings are suggestive of an active chronic intraspinal canal lesion (i.e. radiculopathy) affecting the left L3-S1 nerve root/segments.  T spine MR unremarkable.   [place] teeth extraction        Current Outpatient Medications:    AMBULATORY NON FORMULARY MEDICATION, AFO, L foot Dispense 1 Dx codeL M21.372 Use as needed, Disp:  1 Device, Rfl: 0   gabapentin  (NEURONTIN ) 300 MG capsule, Take 1-2 capsules (300-600 mg total) by mouth 3 (three) times daily as needed., Disp: 90 capsule, Rfl: 3  EXAM:  VITALS per patient if applicable:     08/26/2023   10:03 AM 07/27/2023   12:57 PM 07/22/2023    9:06 AM  Vitals with BMI  Height 5' 9.5  5' 9.5  Weight 159 lbs  163 lbs  BMI 23.15  23.73  Systolic 104 121 887  Diastolic 78 78 70  Pulse 56 84 74     GENERAL: alert, oriented, appears well and in no acute  distress  HEENT: atraumatic, conjunttiva clear, no obvious abnormalities on inspection of external nose and ears  NECK: normal movements of the head and neck  LUNGS: on inspection no signs of respiratory distress, breathing rate appears normal, no obvious gross SOB, gasping or wheezing  CV: no obvious cyanosis  MS: moves all visible extremities without noticeable abnormality  PSYCH/NEURO: pleasant and cooperative, no obvious depression or anxiety, speech and thought processing grossly intact  LABS: none today    Chemistry      Component Value Date/Time   NA 140 02/28/2023 1325   K 4.3 02/28/2023 1325   CL 103 02/28/2023 1325   CO2 30 02/28/2023 1325   BUN 17 02/28/2023 1325   CREATININE 0.99 02/28/2023 1325      Component Value Date/Time   CALCIUM 9.6 02/28/2023 1325   ALKPHOS 63 02/28/2023 1325   AST 28 02/28/2023 1325   ALT 24 02/28/2023 1325   BILITOT 0.8 02/28/2023 1325     ASSESSMENT AND PLAN:  Discussed the following assessment and plan:  Chronic low back pain with lumbar radiculopathy resulting in pain, paresthesia, and weakness in the left leg. Epidural steroid injection helped, PT helping.  His improvement has been gradual. He will drop off his return to work form for me next week.  His FMLA paperwork expires 10/07/2023.    I discussed the assessment and treatment plan with the patient. The patient was provided an opportunity to ask questions and all were answered. The patient agreed with the plan and demonstrated an understanding of the instructions.   F/u: Approximately 1 month  Signed:  Gerlene Hockey, MD           09/30/2023

## 2023-10-03 ENCOUNTER — Telehealth: Payer: Self-pay

## 2023-10-03 NOTE — Telephone Encounter (Signed)
 Signed and put in box to go up front. Signed:  Gerlene Hockey, MD           10/03/2023

## 2023-10-03 NOTE — Telephone Encounter (Signed)
 Type of forms received:  Fitness for Duty   Routed to: Team [employer] received by : Lonell   Individual made aware of 5-7 business day turn around (Y/N): Y  Form completed and patient made aware of charges(Y/N): N/a   Form location:  [employer] inbox in front office Patient needs by Thursday if possible.  Please call patient when ready for pickup [phone]

## 2023-10-03 NOTE — Telephone Encounter (Signed)
Placed on PCP desk to review and sign, if appropriate.  

## 2023-10-04 NOTE — Telephone Encounter (Signed)
 LVM for pt regarding form completion. MyChart message sent as well. Form has been placed up front for pick up.

## 2023-10-11 ENCOUNTER — Telehealth: Payer: Self-pay

## 2023-10-11 NOTE — Telephone Encounter (Signed)
 Type of forms received: [employer] Disability Forms  Routed to:  [employer]  Paperwork received by :  via fax   Individual made aware of 5-7 business day turn around (Y/N): n/a  Form completed and patient made aware of charges(Y/N): N/a  Faxed to :   Form location:  [employer] inbox front office

## 2023-10-11 NOTE — Telephone Encounter (Signed)
Placed on PCP desk to review and sign, if appropriate.  

## 2023-10-12 NOTE — Telephone Encounter (Signed)
 This is an additional form that is pretty detailed. I will need the 7 day window to get it completed.

## 2024-05-08 ENCOUNTER — Encounter: Admitting: Family Medicine
